# Patient Record
Sex: Male | Born: 1980
Health system: Southern US, Community
[De-identification: ages and names within clinical notes are randomized; demographics above are authoritative.]

## PROBLEM LIST (undated history)

## (undated) DIAGNOSIS — F250 Schizoaffective disorder, bipolar type: Secondary | ICD-10-CM

## (undated) DIAGNOSIS — F259 Schizoaffective disorder, unspecified: Secondary | ICD-10-CM

## (undated) HISTORY — PX: LUMBAR SPINE SURGERY: SHX701

---
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
## 2007-11-25 ENCOUNTER — Emergency Department (HOSPITAL_COMMUNITY): Admission: EM | Admit: 2007-11-25 | Discharge: 2007-11-25 | Payer: Self-pay | Admitting: Emergency Medicine

## 2008-03-16 ENCOUNTER — Emergency Department (HOSPITAL_COMMUNITY): Admission: EM | Admit: 2008-03-16 | Discharge: 2008-03-16 | Payer: Self-pay | Admitting: Emergency Medicine

## 2009-05-09 ENCOUNTER — Emergency Department (HOSPITAL_COMMUNITY): Admission: EM | Admit: 2009-05-09 | Discharge: 2009-05-09 | Payer: Self-pay | Admitting: Emergency Medicine

## 2011-08-09 ENCOUNTER — Emergency Department (HOSPITAL_COMMUNITY)
Admission: EM | Admit: 2011-08-09 | Discharge: 2011-08-10 | Disposition: A | Discharge status: Home / Self Care | Payer: Self-pay | Attending: Emergency Medicine | Admitting: Emergency Medicine

## 2011-08-09 ENCOUNTER — Inpatient Hospital Stay (HOSPITAL_COMMUNITY): Admission: AD | Admit: 2011-08-09 | Payer: PRIVATE HEALTH INSURANCE | Source: Ambulatory Visit | Admitting: Psychiatry

## 2011-08-09 ENCOUNTER — Encounter (HOSPITAL_COMMUNITY): Payer: Self-pay | Admitting: *Deleted

## 2011-08-09 DIAGNOSIS — R443 Hallucinations, unspecified: Secondary | ICD-10-CM | POA: Insufficient documentation

## 2011-08-09 DIAGNOSIS — F411 Generalized anxiety disorder: Secondary | ICD-10-CM | POA: Insufficient documentation

## 2011-08-09 DIAGNOSIS — F329 Major depressive disorder, single episode, unspecified: Secondary | ICD-10-CM

## 2011-08-09 DIAGNOSIS — F17200 Nicotine dependence, unspecified, uncomplicated: Secondary | ICD-10-CM | POA: Insufficient documentation

## 2011-08-09 DIAGNOSIS — F3289 Other specified depressive episodes: Secondary | ICD-10-CM | POA: Insufficient documentation

## 2011-08-09 LAB — RAPID URINE DRUG SCREEN, HOSP PERFORMED
Amphetamines: NOT DETECTED
Barbiturates: NOT DETECTED
Cocaine: NOT DETECTED
Tetrahydrocannabinol: POSITIVE — AB

## 2011-08-09 LAB — CBC
HCT: 43.4 % (ref 39.0–52.0)
Hemoglobin: 14.9 g/dL (ref 13.0–17.0)
MCH: 33.3 pg (ref 26.0–34.0)
MCHC: 34.3 g/dL (ref 30.0–36.0)
MCV: 96.9 fL (ref 78.0–100.0)
Platelets: 208 10*3/uL (ref 150–400)
RBC: 4.48 MIL/uL (ref 4.22–5.81)
RDW: 13.3 % (ref 11.5–15.5)
WBC: 8.7 10*3/uL (ref 4.0–10.5)

## 2011-08-09 LAB — DIFFERENTIAL
Basophils Absolute: 0 10*3/uL (ref 0.0–0.1)
Basophils Relative: 0 % (ref 0–1)
Eosinophils Absolute: 0.1 10*3/uL (ref 0.0–0.7)
Neutro Abs: 6 10*3/uL (ref 1.7–7.7)
Neutrophils Relative %: 69 % (ref 43–77)

## 2011-08-09 LAB — COMPREHENSIVE METABOLIC PANEL
AST: 28 U/L (ref 0–37)
Albumin: 4.2 g/dL (ref 3.5–5.2)
Alkaline Phosphatase: 81 U/L (ref 39–117)
Chloride: 101 mEq/L (ref 96–112)
Potassium: 4.3 mEq/L (ref 3.5–5.1)
Total Bilirubin: 0.7 mg/dL (ref 0.3–1.2)
Total Protein: 7.6 g/dL (ref 6.0–8.3)

## 2011-08-09 MED ORDER — LORAZEPAM 1 MG PO TABS
1.0000 mg | ORAL_TABLET | Freq: Three times a day (TID) | ORAL | Status: DC | PRN
  Administered 2011-08-09: 1 mg via ORAL
  Filled 2011-08-09: qty 1

## 2011-08-09 MED ORDER — IBUPROFEN 600 MG PO TABS
600.0000 mg | ORAL_TABLET | Freq: Three times a day (TID) | ORAL | Status: DC | PRN
  Administered 2011-08-09: 600 mg via ORAL
  Filled 2011-08-09: qty 1

## 2011-08-09 MED ORDER — ACETAMINOPHEN 325 MG PO TABS: 650.0000 mg | ORAL_TABLET | ORAL | Status: DC | PRN

## 2011-08-09 MED ORDER — ZOLPIDEM TARTRATE 5 MG PO TABS: 5.0000 mg | ORAL_TABLET | Freq: Every evening | ORAL | Status: DC | PRN

## 2011-08-09 NOTE — ED Notes (Signed)
Pt states "I just can't keep doing this, it's my depression, @ this moment I don't want to hurt myself, don't want to hurt others @ this moment, I'm institutionalized, I'm tired of being trapped, can't do nothing"  [employer] remains with pt

## 2011-08-09 NOTE — ED Notes (Signed)
Request for tele-psych faxed and called in

## 2011-08-09 NOTE — ED Notes (Signed)
Pt. Had two belonging bags.

## 2011-08-09 NOTE — ED Notes (Signed)
Correction to previous pt belonging bags. Two bags locked in locker #40.

## 2011-08-09 NOTE — ED Notes (Signed)
Patient is hearing voice in his head... States that he just stays in the house and is afraid to leave house.  Patient says he does not know how to adapt outside of institution. Says that voice is only happy when he is in institution but does not recognize voice.

## 2011-08-09 NOTE — BH Assessment (Signed)
Assessment Note   Tony Morris is a 31 y.o.African American male. Pt was brought under IVC to [facility] by [employer], Rande Brunt. Grayling Congress, PhD, [facility]. IVC states that pt presented to [facility] due to, "the devil inside me," and pt reports that the, "demon," makes fun of him, laughs at him and commands him to hurt others. IVC also states that pt, "reported thoughts of hurting himself and hurting others and fearing he might respond." Pt reports the demon auditory hallucinations have been around for the last 6-7 months and this is the first time he is seeking help; pt reports he stays inside the home to prevent himself from doing what the demon tells him to do, such as hurt others. Pt reports he is tired of the demon voice and wants it to stop laughing at him. Pt reports feeling like a trapped or caged animal because pt prevents himself from leaving the house due to fear of hurting someone.  During assessment, pt denies current SI, denies current and past self-harm. Pt denies previous inpatient hospitalization or outpatient tx . Pt reports he has previous assault charges due to the demon making him do it; pt reports he had jail time due to assault. Pt denied current pending charges or upcoming court dates. Pt made no eye contact throughout the assessment, appeared anxious, depressed and disheveled. Pt first stated he would agree to inpatient if that was recommended but minutes later asked if he could have outpatient because he is feeling anxious in the ED and feels like he will "snap" with all that is going on (other pts are yelling in ED). During triage pt reported 48 beers in the past week, cocaine and marijuana use; during assessment patient denied substance use, then reported he drank 2 beers 08/08/11, and denied any other substance use. Pt was positive for THC.   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 No pertinent past medical history. Axis IV: economic  problems, housing problems, occupational problems and other psychosocial or environmental problems Axis V: 15  Past Medical History: History reviewed. No pertinent past medical history.  Past Surgical History  Procedure Date  . Lumbar spine surgery     Family History: No family history on file.  Social History:  reports that he has been smoking.  He does not have any smokeless tobacco history on file. He reports that he drinks about 28.8 ounces of alcohol per week. He reports that he uses illicit drugs (Cocaine and Marijuana).  Additional Social History:  Alcohol / Drug Use History of alcohol / drug use?: Yes Allergies: No Known Allergies  Home Medications:  Medications Prior to Admission  Medication Dose Route Frequency Provider Last Rate Last Dose  . acetaminophen (TYLENOL) tablet 650 mg  650 mg Oral Q4H PRN Juliet Rude. Pickering, MD      . ibuprofen (ADVIL,MOTRIN) tablet 600 mg  600 mg Oral Q8H PRN Juliet Rude. Pickering, MD   600 mg at 08/09/11 1920  . LORazepam (ATIVAN) tablet 1 mg  1 mg Oral Q8H PRN Juliet Rude. Pickering, MD   1 mg at 08/09/11 1919  . zolpidem (AMBIEN) tablet 5 mg  5 mg Oral QHS PRN Juliet Rude. Rubin Payor, MD       No current outpatient prescriptions on file as of 08/09/2011.    OB/GYN Status:  No LMP for male patient.  General Assessment Data Location of Assessment: [facility] Living Arrangements: Relatives;Parent (Pt reports lives at different  pple's homes-mom, cousin) Can pt return to current living arrangement?: Yes Admission Status: Involuntary Is patient capable of signing voluntary admission?: No Transfer from: Other (Comment) (Pt assessed by Vesta Mixer, who signed IVC, trnsf [facility]) Referral Source: Other Museum/gallery curator)  Education Status Is patient currently in school?: No  Risk to self Suicidal Ideation: No-Not Currently/Within Last 6 Months (Pt reports SI comes and goes but denies current SI thoughts) Suicidal Intent: No-Not Currently/Within Last 6 Months Is  patient at risk for suicide?: No Suicidal Plan?: No Access to Means: No What has been your use of drugs/alcohol within the last 12 months?: Pt denies SA, reports two beers 08/08/11, denies other drug use Previous Attempts/Gestures: No How many times?: 0  Other Self Harm Risks: Denies Triggers for Past Attempts: None known Intentional Self Injurious Behavior: None Family Suicide History: Unknown (Pt reported he did not know) Recent stressful life event(s): Other (Comment) (Pt reports AH and moving a lot) Persecutory voices/beliefs?: No Depression: Yes Depression Symptoms: Insomnia;Tearfulness;Isolating;Guilt;Loss of interest in usual pleasures;Feeling worthless/self pity;Feeling angry/irritable;Fatigue Substance abuse history and/or treatment for substance abuse?: Yes (Pt denied at assessment; Pt reported substance use at triage) Suicide prevention information given to non-admitted patients: Not applicable  Risk to Others Homicidal Ideation: Yes-Currently Present Thoughts of Harm to Others: Yes-Currently Present Comment - Thoughts of Harm to Others: Pt reports demon inside of him tells him to do violent actions; denies specific plan currently; pt reported previous assault charges for which he went to jail Current Homicidal Intent: No-Not Currently/Within Last 6 Months (Pt denies current intent but reports he feels he can "snap" ) Current Homicidal Plan: No-Not Currently/Within Last 6 Months Access to Homicidal Means: No Identified Victim: Denies History of harm to others?: Yes Assessment of Violence: On admission Violent Behavior Description: Pt reports he has past assualt charges; denies current pending charges or court dates Does patient have access to weapons?: No Criminal Charges Pending?: No Does patient have a court date: No  Psychosis Hallucinations: Auditory (Pt reports a demon is laughing at him & commanding him) Delusions: None noted  Mental Status Report Appear/Hygiene:  Disheveled Eye Contact: Poor Motor Activity: Agitation;Restlessness Speech: Logical/coherent Level of Consciousness: Alert Mood: Depressed;Helpless;Sad Affect: Depressed;Anxious Anxiety Level: Moderate Thought Processes: Coherent;Relevant Judgement: Impaired Orientation: Person;Place;Time;Situation Obsessive Compulsive Thoughts/Behaviors: None  Cognitive Functioning Concentration: Decreased Memory: Remote Intact;Recent Intact IQ: Average Insight: Poor Impulse Control: Poor Appetite: Poor Weight Loss: 0  Weight Gain: 0  Sleep: Decreased Total Hours of Sleep: 4  Vegetative Symptoms: None (Pt not leaving home for fear of what voices tell him to do)  Prior Inpatient Therapy Prior Inpatient Therapy: No Prior Therapy Dates: N/A Prior Therapy Facilty/Provider(s): N/A Reason for Treatment: N/A  Prior Outpatient Therapy Prior Outpatient Therapy: No Prior Therapy Dates: N/A Prior Therapy Facilty/Provider(s): N/A Reason for Treatment: N/A  ADL Screening (condition at time of admission) Patient's cognitive ability adequate to safely complete daily activities?: Yes Patient able to express need for assistance with ADLs?: Yes Independently performs ADLs?: Yes Weakness of Legs: None Weakness of Arms/Hands: None  Home Assistive Devices/Equipment Home Assistive Devices/Equipment: None    Abuse/Neglect Assessment (Assessment to be complete while patient is alone) Physical Abuse: Denies Verbal Abuse: Denies Sexual Abuse: Denies Exploitation of patient/patient's resources: Denies Self-Neglect: Denies Values / Beliefs Cultural Requests During Hospitalization: None Spiritual Requests During Hospitalization: None     Nutrition Screen Diet: NPO Unintentional weight loss greater than 10lbs within the last month: No Dysphagia: No Patient appears severely malnourished: No Pregnant or  Lactating: No  Additional Information 1:1 In Past 12 Months?: No CIRT Risk: No Elopement  Risk: No Does patient have medical clearance?: Yes     Disposition: Pt will be referred to [facility] for placement. Disposition Disposition of Patient: Other dispositions (Will ask for [facility] placement)  On Site Evaluation by:   Reviewed with Physician:     Constance Haw, Irwin Brakeman 08/09/2011 8:04 PM

## 2011-08-09 NOTE — ED Notes (Signed)
Pt states he has been in and out of prison and feels like he "can't do it anymore".  Pt denies SI/HI but has been depressed and having intermittent audible hallucinations.

## 2011-08-09 NOTE — ED Notes (Signed)
Two patient belonging bags locked in locker #39 

## 2011-08-09 NOTE — ED Provider Notes (Signed)
History     CSN: [id number]  Arrival date & time 08/09/11  1505   First MD Initiated Contact with Patient 08/09/11 1617      Chief Complaint  Patient presents with  . Medical Clearance    (Consider location/radiation/quality/duration/timing/severity/associated sxs/prior treatment) The history is provided by the patient.   patient was brought in by [facility] after being involuntarily committed at [facility]. He states he is depressed. He states his hearing voices. He states he feels is too she lives from being in prison. He states is the devil on the inside and was telling him things. No fevers. No headache. He states he has a history depression but is not on any medications and is not supposed to be on medications. He states he's been treated for this before. No chest. He states he's cocaine once a month ago. He does drink alcohol. No other drugs.  History reviewed. No pertinent past medical history.  Past Surgical History  Procedure Date  . Lumbar spine surgery     No family history on file.  History  Substance Use Topics  . Smoking status: Current Everyday Smoker -- 1.0 packs/day  . Smokeless tobacco: Not on file  . Alcohol Use: 28.8 oz/week    48 Cans of beer per week      Review of Systems  Constitutional: Negative for activity change and appetite change.  HENT: Negative for neck stiffness.   Eyes: Negative for pain.  Respiratory: Negative for chest tightness and shortness of breath.   Cardiovascular: Negative for chest pain and leg swelling.  Gastrointestinal: Negative for nausea, vomiting, abdominal pain and diarrhea.  Genitourinary: Negative for flank pain.  Musculoskeletal: Negative for back pain.  Skin: Negative for rash.  Neurological: Negative for weakness, numbness and headaches.  Psychiatric/Behavioral: Positive for hallucinations. Negative for suicidal ideas, behavioral problems and confusion. The patient is nervous/anxious.     Allergies  Review of patient's  allergies indicates no known allergies.  Home Medications   Current Outpatient Rx  Name Route Sig Dispense Refill  . CITALOPRAM HYDROBROMIDE 10 MG PO TABS Oral Take 1 tablet (10 mg total) by mouth daily. 30 tablet 0    BP 108/72  Pulse 86  Temp(Src) 97.8 F (36.6 C) (Oral)  Resp 18  Wt 170 lb (77.111 kg)  SpO2 95%  Physical Exam  Nursing note and vitals reviewed. Constitutional: He is oriented to person, place, and time. He appears well-developed and well-nourished.  HENT:  Head: Normocephalic and atraumatic.  Eyes: EOM are normal. Pupils are equal, round, and reactive to light.  Neck: Normal range of motion. Neck supple.  Cardiovascular: Normal rate, regular rhythm and normal heart sounds.   No murmur heard. Pulmonary/Chest: Effort normal and breath sounds normal.  Abdominal: Soft. Bowel sounds are normal. He exhibits no distension and no mass. There is no tenderness. There is no rebound and no guarding.  Musculoskeletal: Normal range of motion. He exhibits no edema.  Neurological: He is alert and oriented to person, place, and time. No cranial nerve deficit.  Skin: Skin is warm and dry.  Psychiatric:       Patient appears anxious and pressured.    ED Course  Procedures (including critical care time)  Labs Reviewed  COMPREHENSIVE METABOLIC PANEL - Abnormal; Notable for the following:    Glucose, Bld 101 (*)    All other components within normal limits  URINE RAPID DRUG SCREEN (HOSP PERFORMED) - Abnormal; Notable for the following:    Tetrahydrocannabinol POSITIVE (*)  All other components within normal limits  CBC  DIFFERENTIAL  ETHANOL   No results found.   1. Depression       MDM  Patient came in IVC from [place]. He stated he was depressed and having hallucinations. Lab work is reassuring. He was seen by Telepsych and the IVC was rescinded. He was started on Celexa. He'll followup after discussing with ACT and possibly with Janeece Agee  R. Rubin Payor, MD 08/10/11 1610

## 2011-08-10 MED ORDER — CITALOPRAM HYDROBROMIDE 10 MG PO TABS: 10.0000 mg | ORAL_TABLET | Freq: Every day | ORAL | Status: DC

## 2011-08-10 NOTE — ED Notes (Signed)
Discharge instructions reviewed and pt verbalizes understanding. Pt able to contract for safety and states he does not want to hurt himself. Departs in safe and stable condition.

## 2013-05-04 ENCOUNTER — Emergency Department (HOSPITAL_BASED_OUTPATIENT_CLINIC_OR_DEPARTMENT_OTHER): Payer: No Typology Code available for payment source

## 2013-05-04 ENCOUNTER — Encounter (HOSPITAL_BASED_OUTPATIENT_CLINIC_OR_DEPARTMENT_OTHER): Payer: Self-pay | Admitting: Emergency Medicine

## 2013-05-04 ENCOUNTER — Emergency Department (HOSPITAL_BASED_OUTPATIENT_CLINIC_OR_DEPARTMENT_OTHER)
Admission: EM | Admit: 2013-05-04 | Discharge: 2013-05-04 | Disposition: A | Discharge status: Home / Self Care | Payer: No Typology Code available for payment source | Attending: Emergency Medicine | Admitting: Emergency Medicine

## 2013-05-04 DIAGNOSIS — S29019A Strain of muscle and tendon of unspecified wall of thorax, initial encounter: Secondary | ICD-10-CM

## 2013-05-04 DIAGNOSIS — F17200 Nicotine dependence, unspecified, uncomplicated: Secondary | ICD-10-CM | POA: Insufficient documentation

## 2013-05-04 DIAGNOSIS — F259 Schizoaffective disorder, unspecified: Secondary | ICD-10-CM | POA: Insufficient documentation

## 2013-05-04 DIAGNOSIS — Z79899 Other long term (current) drug therapy: Secondary | ICD-10-CM | POA: Insufficient documentation

## 2013-05-04 DIAGNOSIS — Y92410 Unspecified street and highway as the place of occurrence of the external cause: Secondary | ICD-10-CM | POA: Insufficient documentation

## 2013-05-04 DIAGNOSIS — S161XXA Strain of muscle, fascia and tendon at neck level, initial encounter: Secondary | ICD-10-CM

## 2013-05-04 DIAGNOSIS — Y939 Activity, unspecified: Secondary | ICD-10-CM | POA: Insufficient documentation

## 2013-05-04 DIAGNOSIS — S239XXA Sprain of unspecified parts of thorax, initial encounter: Secondary | ICD-10-CM | POA: Insufficient documentation

## 2013-05-04 DIAGNOSIS — S335XXA Sprain of ligaments of lumbar spine, initial encounter: Secondary | ICD-10-CM | POA: Insufficient documentation

## 2013-05-04 HISTORY — DX: Schizoaffective disorder, bipolar type: F25.0

## 2013-05-04 HISTORY — DX: Schizoaffective disorder, unspecified: F25.9

## 2013-05-04 MED ORDER — CYCLOBENZAPRINE HCL 10 MG PO TABS: 10.0000 mg | ORAL_TABLET | Freq: Two times a day (BID) | ORAL | Status: DC | PRN

## 2013-05-04 MED ORDER — OXYCODONE-ACETAMINOPHEN 5-325 MG PO TABS: 1.0000 | ORAL_TABLET | Freq: Four times a day (QID) | ORAL | Status: DC | PRN

## 2013-05-04 MED ORDER — OXYCODONE-ACETAMINOPHEN 5-325 MG PO TABS
1.0000 | ORAL_TABLET | Freq: Once | ORAL | Status: AC
  Administered 2013-05-04: 1 via ORAL
  Filled 2013-05-04: qty 1
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
## 2013-05-05 ENCOUNTER — Encounter (HOSPITAL_BASED_OUTPATIENT_CLINIC_OR_DEPARTMENT_OTHER): Payer: Self-pay | Admitting: Emergency Medicine

## 2013-05-05 ENCOUNTER — Emergency Department (HOSPITAL_BASED_OUTPATIENT_CLINIC_OR_DEPARTMENT_OTHER): Payer: No Typology Code available for payment source

## 2013-05-05 ENCOUNTER — Emergency Department (HOSPITAL_BASED_OUTPATIENT_CLINIC_OR_DEPARTMENT_OTHER)
Admission: EM | Admit: 2013-05-05 | Discharge: 2013-05-05 | Disposition: A | Discharge status: Home / Self Care | Payer: No Typology Code available for payment source | Attending: Emergency Medicine | Admitting: Emergency Medicine

## 2013-05-05 DIAGNOSIS — Z79899 Other long term (current) drug therapy: Secondary | ICD-10-CM | POA: Insufficient documentation

## 2013-05-05 DIAGNOSIS — F17200 Nicotine dependence, unspecified, uncomplicated: Secondary | ICD-10-CM | POA: Insufficient documentation

## 2013-05-05 DIAGNOSIS — M5430 Sciatica, unspecified side: Secondary | ICD-10-CM | POA: Insufficient documentation

## 2013-05-05 DIAGNOSIS — M25559 Pain in unspecified hip: Secondary | ICD-10-CM | POA: Insufficient documentation

## 2013-05-05 DIAGNOSIS — G8911 Acute pain due to trauma: Secondary | ICD-10-CM | POA: Insufficient documentation

## 2013-05-05 DIAGNOSIS — Z8659 Personal history of other mental and behavioral disorders: Secondary | ICD-10-CM | POA: Insufficient documentation

## 2013-05-05 DIAGNOSIS — M5431 Sciatica, right side: Secondary | ICD-10-CM

## 2013-05-05 MED ORDER — MELOXICAM 7.5 MG PO TABS: 7.5000 mg | ORAL_TABLET | Freq: Every day | ORAL | Status: DC

## 2013-05-05 MED ORDER — IBUPROFEN 800 MG PO TABS
800.0000 mg | ORAL_TABLET | Freq: Once | ORAL | Status: AC
  Administered 2013-05-05: 800 mg via ORAL
  Filled 2013-05-05: qty 1

## 2013-05-05 NOTE — ED Notes (Signed)
MD at bedside. 
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
## 2013-05-05 NOTE — ED Notes (Signed)
Patient transported to X-ray via wheelchair per tech. 
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
## 2013-05-07 ENCOUNTER — Encounter (HOSPITAL_BASED_OUTPATIENT_CLINIC_OR_DEPARTMENT_OTHER): Payer: Self-pay | Admitting: Emergency Medicine

## 2014-12-09 ENCOUNTER — Encounter (HOSPITAL_BASED_OUTPATIENT_CLINIC_OR_DEPARTMENT_OTHER): Payer: Self-pay | Admitting: *Deleted

## 2014-12-09 ENCOUNTER — Emergency Department (HOSPITAL_BASED_OUTPATIENT_CLINIC_OR_DEPARTMENT_OTHER)
Admission: EM | Admit: 2014-12-09 | Discharge: 2014-12-09 | Disposition: A | Discharge status: Home / Self Care | Payer: Medicaid Other | Attending: Emergency Medicine | Admitting: Emergency Medicine

## 2014-12-09 DIAGNOSIS — Z720 Tobacco use: Secondary | ICD-10-CM | POA: Insufficient documentation

## 2014-12-09 DIAGNOSIS — Z202 Contact with and (suspected) exposure to infections with a predominantly sexual mode of transmission: Secondary | ICD-10-CM | POA: Diagnosis present

## 2014-12-09 DIAGNOSIS — Z79899 Other long term (current) drug therapy: Secondary | ICD-10-CM | POA: Insufficient documentation

## 2014-12-09 DIAGNOSIS — F259 Schizoaffective disorder, unspecified: Secondary | ICD-10-CM | POA: Diagnosis not present

## 2014-12-09 MED ORDER — CEFTRIAXONE SODIUM 250 MG IJ SOLR
250.0000 mg | Freq: Once | INTRAMUSCULAR | Status: AC
  Administered 2014-12-09: 250 mg via INTRAMUSCULAR
  Filled 2014-12-09: qty 250

## 2014-12-09 MED ORDER — AZITHROMYCIN 250 MG PO TABS
1000.0000 mg | ORAL_TABLET | Freq: Once | ORAL | Status: AC
  Administered 2014-12-09: 1000 mg via ORAL
  Filled 2014-12-09: qty 4

## 2014-12-09 NOTE — ED Notes (Signed)
Pt reports girlfriend has an STD. Pt denies symptoms.

## 2014-12-09 NOTE — ED Provider Notes (Signed)
CSN: [id number]     Arrival date & time 12/09/14  1102 History   First MD Initiated Contact with Patient 12/09/14 1115     Chief Complaint  Patient presents with  . Exposure to STD     (Consider location/radiation/quality/duration/timing/severity/associated sxs/prior Treatment) HPI Tony Morris is a 34 y.o. male Who presents to emergency department with a concern of possible exposure to STD. Patient states his girlfriend was just diagnosed with chlamydia. He denies any symptoms. No penile discharge, no dysuria, no pain with intercourse. Denies fever or chills. No history of STD in the past. Patient requests treatment.  Past Medical History  Diagnosis Date  . Schizo affective schizophrenia    Past Surgical History  Procedure Laterality Date  . Lumbar spine surgery     History reviewed. No pertinent family history. History  Substance Use Topics  . Smoking status: Current Every Day Smoker -- 0.50 packs/day    Types: Cigarettes  . Smokeless tobacco: Not on file  . Alcohol Use: No    Review of Systems  Constitutional: Negative for fever and chills.  Genitourinary: Negative for dysuria, penile swelling, scrotal swelling and penile pain.  Skin: Positive for rash.      Allergies  Review of patient's allergies indicates no known allergies.  Home Medications   Prior to Admission medications   Medication Sig Start Date End Date Taking? Authorizing Provider  citalopram (CELEXA) 10 MG tablet Take 1 tablet (10 mg total) by mouth daily. 08/10/11 08/09/12  Benjiman CoreNathan Pickering, MD  escitalopram (LEXAPRO) 10 MG tablet Take by mouth daily.    Historical Provider, MD  QUEtiapine (SEROQUEL) 100 MG tablet Take by mouth at bedtime.    Historical Provider, MD   BP 116/67 mmHg  Pulse 69  Temp(Src) 98.4 F (36.9 C) (Oral)  Resp 18  Ht 6\' 2"  (1.88 m)  Wt 160 lb (72.576 kg)  BMI 20.53 kg/m2  SpO2 98% Physical Exam  Constitutional: He appears well-developed and well-nourished. No  distress.  Eyes: Conjunctivae are normal.  Neck: Neck supple.  Genitourinary: Penis normal.  Skin: Skin is warm and dry.  Nursing note and vitals reviewed.   ED Course  Procedures (including critical care time) Labs Review Labs Reviewed - No data to display  Imaging Review No results found.   EKG Interpretation None      MDM   Final diagnoses:  Exposure to STD   Patient with no complaints, exposure to STD, girlfriend tested positive for chlamydia. Will treat with Rocephin 250 mg IM, Zithromax 1 g by mouth. Follow up with Health department.  Filed Vitals:   12/09/14 1112  BP: 116/67  Pulse: 69  Temp: 98.4 F (36.9 C)  TempSrc: Oral  Resp: 18  Height: 6\' 2"  (1.88 m)  Weight: 160 lb (72.576 kg)  SpO2: 98%       Jaynie Crumbleatyana Denis Koppel, PA-C 12/09/14 1702  Arby BarretteMarcy Pfeiffer, MD 12/09/14 1715

## 2014-12-09 NOTE — Discharge Instructions (Signed)
Follow up with health dept as needed. No intercourse for 1 week.    Sexually Transmitted Disease A sexually transmitted disease (STD) is a disease or infection that may be passed (transmitted) from person to person, usually during sexual activity. This may happen by way of saliva, semen, blood, vaginal mucus, or urine. Common STDs include:   Gonorrhea.   Chlamydia.   Syphilis.   HIV and AIDS.   Genital herpes.   Hepatitis B and C.   Trichomonas.   Human papillomavirus (HPV).   Pubic lice.   Scabies.  Mites.  Bacterial vaginosis. WHAT ARE CAUSES OF STDs? An STD may be caused by bacteria, a virus, or parasites. STDs are often transmitted during sexual activity if one person is infected. However, they may also be transmitted through nonsexual means. STDs may be transmitted after:   Sexual intercourse with an infected person.   Sharing sex toys with an infected person.   Sharing needles with an infected person or using unclean piercing or tattoo needles.  Having intimate contact with the genitals, mouth, or rectal areas of an infected person.   Exposure to infected fluids during birth. WHAT ARE THE SIGNS AND SYMPTOMS OF STDs? Different STDs have different symptoms. Some people may not have any symptoms. If symptoms are present, they may include:   Painful or bloody urination.   Pain in the pelvis, abdomen, vagina, anus, throat, or eyes.   A skin rash, itching, or irritation.  Growths, ulcerations, blisters, or sores in the genital and anal areas.  Abnormal vaginal discharge with or without bad odor.   Penile discharge in men.   Fever.   Pain or bleeding during sexual intercourse.   Swollen glands in the groin area.   Yellow skin and eyes (jaundice). This is seen with hepatitis.   Swollen testicles.  Infertility.  Sores and blisters in the mouth. HOW ARE STDs DIAGNOSED? To make a diagnosis, your health care provider may:   Take a  medical history.   Perform a physical exam.   Take a sample of any discharge to examine.  Swab the throat, cervix, opening to the penis, rectum, or vagina for testing.  Test a sample of your first morning urine.   Perform blood tests.   Perform a Pap test, if this applies.   Perform a colposcopy.   Perform a laparoscopy.  HOW ARE STDs TREATED? Treatment depends on the STD. Some STDs may be treated but not cured.   Chlamydia, gonorrhea, trichomonas, and syphilis can be cured with antibiotic medicine.   Genital herpes, hepatitis, and HIV can be treated, but not cured, with prescribed medicines. The medicines lessen symptoms.   Genital warts from HPV can be treated with medicine or by freezing, burning (electrocautery), or surgery. Warts may come back.   HPV cannot be cured with medicine or surgery. However, abnormal areas may be removed from the cervix, vagina, or vulva.   If your diagnosis is confirmed, your recent sexual partners need treatment. This is true even if they are symptom-free or have a negative culture or evaluation. They should not have sex until their health care providers say it is okay. HOW CAN I REDUCE MY RISK OF GETTING AN STD? Take these steps to reduce your risk of getting an STD:  Use latex condoms, dental dams, and water-soluble lubricants during sexual activity. Do not use petroleum jelly or oils.  Avoid having multiple sex partners.  Do not have sex with someone who has other sex partners.  Do not have sex with anyone you do not know or who is at high risk for an STD.  Avoid risky sex practices that can break your skin.  Do not have sex if you have open sores on your mouth or skin.  Avoid drinking too much alcohol or taking illegal drugs. Alcohol and drugs can affect your judgment and put you in a vulnerable position.  Avoid engaging in oral and anal sex acts.  Get vaccinated for HPV and hepatitis. If you have not received these  vaccines in the past, talk to your health care provider about whether one or both might be right for you.   If you are at risk of being infected with HIV, it is recommended that you take a prescription medicine daily to prevent HIV infection. This is called pre-exposure prophylaxis (PrEP). You are considered at risk if:  You are a man who has sex with other men (MSM).  You are a heterosexual man or woman and are sexually active with more than one partner.  You take drugs by injection.  You are sexually active with a partner who has HIV.  Talk with your health care provider about whether you are at high risk of being infected with HIV. If you choose to begin PrEP, you should first be tested for HIV. You should then be tested every 3 months for as long as you are taking PrEP.  WHAT SHOULD I DO IF I THINK I HAVE AN STD?  See your health care provider.   Tell your sexual partner(s). They should be tested and treated for any STDs.  Do not have sex until your health care provider says it is okay. WHEN SHOULD I GET IMMEDIATE MEDICAL CARE? Contact your health care provider right away if:   You have severe abdominal pain.  You are a man and notice swelling or pain in your testicles.  You are a woman and notice swelling or pain in your vagina. Document Released: 09/18/2002 Document Revised: 07/03/2013 Document Reviewed: 01/16/2013 [facility] Patient Information 2015 [place], [place]. This information is not intended to replace advice given to you by your health care provider. Make sure you discuss any questions you have with your health care provider.

## 2014-12-17 ENCOUNTER — Emergency Department (HOSPITAL_COMMUNITY)
Admission: EM | Admit: 2014-12-17 | Discharge: 2014-12-17 | Disposition: A | Discharge status: Home / Self Care | Payer: Self-pay

## 2014-12-17 ENCOUNTER — Emergency Department (HOSPITAL_COMMUNITY)
Admission: EM | Admit: 2014-12-17 | Discharge: 2014-12-17 | Disposition: A | Discharge status: Home / Self Care | Payer: Medicaid Other | Attending: Emergency Medicine | Admitting: Emergency Medicine

## 2014-12-17 ENCOUNTER — Encounter (HOSPITAL_COMMUNITY): Payer: Self-pay | Admitting: Physical Medicine and Rehabilitation

## 2014-12-17 DIAGNOSIS — Z720 Tobacco use: Secondary | ICD-10-CM | POA: Diagnosis not present

## 2014-12-17 DIAGNOSIS — Z202 Contact with and (suspected) exposure to infections with a predominantly sexual mode of transmission: Secondary | ICD-10-CM | POA: Diagnosis not present

## 2014-12-17 DIAGNOSIS — F259 Schizoaffective disorder, unspecified: Secondary | ICD-10-CM | POA: Diagnosis not present

## 2014-12-17 LAB — URINALYSIS, ROUTINE W REFLEX MICROSCOPIC
BILIRUBIN URINE: NEGATIVE
Glucose, UA: NEGATIVE mg/dL
HGB URINE DIPSTICK: NEGATIVE
KETONES UR: NEGATIVE mg/dL
Leukocytes, UA: NEGATIVE
Nitrite: NEGATIVE
PH: 6.5 (ref 5.0–8.0)
PROTEIN: NEGATIVE mg/dL
Specific Gravity, Urine: 1.01 (ref 1.005–1.030)
Urobilinogen, UA: 0.2 mg/dL (ref 0.0–1.0)

## 2014-12-17 MED ORDER — AZITHROMYCIN 250 MG PO TABS
1000.0000 mg | ORAL_TABLET | Freq: Once | ORAL | Status: AC
  Administered 2014-12-17: 1000 mg via ORAL
  Filled 2014-12-17: qty 4

## 2014-12-17 NOTE — ED Notes (Signed)
David, NP at the bedside.  

## 2014-12-17 NOTE — ED Provider Notes (Signed)
CSN: [id number]     Arrival date & time 12/17/14  1653 History  This chart was scribed for non-physician practitioner, Felicie Mornavid Kirstan Fentress, NP working with Derwood KaplanAnkit Nanavati, MD by Gwenyth Oberatherine Macek, ED scribe. This patient was seen in room TR10C/TR10C and the patient's care was started at 5:35 PM   Chief Complaint  Patient presents with  . Exposure to STD   The history is provided by the patient. No language interpreter was used.   HPI Comments: Tony Morris is a 34 y.o. male who presents to the Emergency Department complaining of possible exposure to chlamydia. Pt reports that his "lady friend" tested positive for chlamydia during a recent pap smear. He endorses unprotected intercourse and states that she is his only partner. She does not have any symptoms currently. Pt denies penile pain, penile discharge, dysuria, testicular pain, fever and chills as associated symptoms.   Past Medical History  Diagnosis Date  . Schizo affective schizophrenia    Past Surgical History  Procedure Laterality Date  . Lumbar spine surgery     History reviewed. No pertinent family history. History  Substance Use Topics  . Smoking status: Current Every Day Smoker -- 0.50 packs/day    Types: Cigarettes  . Smokeless tobacco: Not on file  . Alcohol Use: No    Review of Systems  Constitutional: Negative for fever and chills.  Genitourinary: Negative for dysuria, discharge, penile pain and testicular pain.  All other systems reviewed and are negative.     Allergies  Review of patient's allergies indicates no known allergies.  Home Medications   Prior to Admission medications   Medication Sig Start Date End Date Taking? Authorizing Provider  citalopram (CELEXA) 10 MG tablet Take 1 tablet (10 mg total) by mouth daily. 08/10/11 08/09/12  Benjiman CoreNathan Pickering, MD  escitalopram (LEXAPRO) 10 MG tablet Take by mouth daily.    Historical Provider, MD  QUEtiapine (SEROQUEL) 100 MG tablet Take by mouth at bedtime.     Historical Provider, MD   BP 111/65 mmHg  Pulse 72  Temp(Src) 98 F (36.7 C) (Oral)  Resp 16  Ht 6\' 2"  (1.88 m)  Wt 162 lb (73.483 kg)  BMI 20.79 kg/m2  SpO2 98% Physical Exam  Constitutional: He appears well-developed and well-nourished. No distress.  HENT:  Head: Normocephalic and atraumatic.  Eyes: Conjunctivae and EOM are normal.  Neck: Neck supple. No tracheal deviation present.  Cardiovascular: Normal rate, regular rhythm and normal heart sounds.   Pulmonary/Chest: Effort normal and breath sounds normal. No respiratory distress.  Skin: Skin is warm and dry.  Psychiatric: He has a normal mood and affect. His behavior is normal.  Nursing note and vitals reviewed.   ED Course  Procedures   DIAGNOSTIC STUDIES: Oxygen Saturation is 98% on RA, normal by my interpretation.    COORDINATION OF CARE: 5:37 PM Discussed treatment plan with pt which includes Azithromycin. Pt agreed to plan.   Labs Review Labs Reviewed  URINALYSIS, ROUTINE W REFLEX MICROSCOPIC (NOT AT [facility])    Imaging Review No results found.   EKG Interpretation None      MDM   Final diagnoses:  None    Exposure to chlamydia. Azithromycin 1 gram in ED.  I personally performed the services described in this documentation, which was scribed in my presence. The recorded information has been reviewed and is accurate.    Felicie Mornavid Pratyush Ammon, NP 12/18/14 0025  Derwood KaplanAnkit Nanavati, MD 12/19/14 [id number]

## 2014-12-17 NOTE — ED Notes (Signed)
Pt presents to department for evaluation of possible STD. Reports girlfriend tested positive for chlamydia. Pt requesting to be tested. Denies symptoms at the time.

## 2014-12-17 NOTE — Discharge Instructions (Signed)
Chlamydia Chlamydia is an infection. It is spread through sexual contact. Chlamydia can be in different areas of the body. These areas include the urethra, throat, or rectum. It is important to treat chlamydia as soon as possible. It can damage other organs.  CAUSES  Chlamydia is caused by bacteria. It is a sexually transmitted disease. This means that it is passed from an infected partner during intimate contact. This contact could be with the genitals, mouth, or rectal area.  SIGNS AND SYMPTOMS  There may not be any symptoms. This is often the case early in the infection. If there are symptoms, they are usually mild and may only be noticeable in the morning. Symptoms you may notice include:   Burning with urination.  Pain or swelling in the testicles.  Watery mucus-like discharge from the penis.  Long-standing (chronic) pelvic pain after frequent infections.  Pain, swelling, or itching around the anus.  A sore throat.  Itching, burning, or redness in the eyes, or discharge from the eyes. .  TREATMENT  Chlamydia is treated with antibiotic medicines.  HOME CARE INSTRUCTIONS   Take medicines only as directed by your health care provider.   Rest.   Inform any sexual partners about your infection. Even if they are symptom free or have a negative culture or evaluation, they should be treated for the condition.   Do not have sex (intercourse) until treatment is completed and your health care provider says it is okay.   Keep all follow-up visits as directed by your health care provider.   Not all test results are available during your visit. If your test results are not back during the visit, make an appointment with your health care provider to find out the results. Do not assume everything is normal if you have not heard from your health care provider or the medical facility. It is your responsibility to get your test results. SEEK MEDICAL CARE IF:  You develop new joint  pain.  You have a fever. SEEK IMMEDIATE MEDICAL CARE IF:   Your pain increases.   You have abnormal discharge.   You have pain during intercourse. MAKE SURE YOU:   Understand these instructions.  Will watch your condition.  Will get help right away if you are not doing well or get worse. Document Released: 06/28/2005 Document Revised: 11/12/2013 Document Reviewed: 01/04/2013 [facility] Patient Information 2015 [place], [place]. This information is not intended to replace advice given to you by your health care provider. Make sure you discuss any questions you have with your health care provider.

## 2015-02-23 ENCOUNTER — Emergency Department (HOSPITAL_BASED_OUTPATIENT_CLINIC_OR_DEPARTMENT_OTHER)
Admission: EM | Admit: 2015-02-23 | Discharge: 2015-02-23 | Disposition: A | Discharge status: Home / Self Care | Payer: Medicaid Other | Attending: Emergency Medicine | Admitting: Emergency Medicine

## 2015-02-23 ENCOUNTER — Encounter (HOSPITAL_BASED_OUTPATIENT_CLINIC_OR_DEPARTMENT_OTHER): Payer: Self-pay | Admitting: Emergency Medicine

## 2015-02-23 DIAGNOSIS — Z79899 Other long term (current) drug therapy: Secondary | ICD-10-CM | POA: Diagnosis not present

## 2015-02-23 DIAGNOSIS — M62838 Other muscle spasm: Secondary | ICD-10-CM | POA: Insufficient documentation

## 2015-02-23 DIAGNOSIS — L259 Unspecified contact dermatitis, unspecified cause: Secondary | ICD-10-CM | POA: Diagnosis not present

## 2015-02-23 DIAGNOSIS — Z8659 Personal history of other mental and behavioral disorders: Secondary | ICD-10-CM | POA: Insufficient documentation

## 2015-02-23 DIAGNOSIS — M25511 Pain in right shoulder: Secondary | ICD-10-CM | POA: Insufficient documentation

## 2015-02-23 DIAGNOSIS — Z720 Tobacco use: Secondary | ICD-10-CM | POA: Insufficient documentation

## 2015-02-23 DIAGNOSIS — R21 Rash and other nonspecific skin eruption: Secondary | ICD-10-CM | POA: Diagnosis present

## 2015-02-23 MED ORDER — IBUPROFEN 800 MG PO TABS: 800.0000 mg | ORAL_TABLET | Freq: Three times a day (TID) | ORAL | Status: DC

## 2015-02-23 MED ORDER — TRIAMCINOLONE ACETONIDE 0.1 % EX CREA: 1.0000 "application " | TOPICAL_CREAM | Freq: Two times a day (BID) | CUTANEOUS | Status: DC

## 2015-02-23 NOTE — ED Notes (Addendum)
Patient has 2 larger areas of redness and rash to his back. Paient also reports that he has a lump under his left axilla region and swelling to his neck

## 2015-02-23 NOTE — ED Notes (Signed)
Denies any injury or straining to shoulders

## 2015-02-23 NOTE — Discharge Instructions (Signed)
Apply triamcinolone cream twice daily to the affected area. Use ibuprofen as directed as needed for shoulder pain.  Contact Dermatitis Contact dermatitis is a reaction to certain substances that touch the skin. Contact dermatitis can be either irritant contact dermatitis or allergic contact dermatitis. Irritant contact dermatitis does not require previous exposure to the substance for a reaction to occur.Allergic contact dermatitis only occurs if you have been exposed to the substance before. Upon a repeat exposure, your body reacts to the substance.  CAUSES  Many substances can cause contact dermatitis. Irritant dermatitis is most commonly caused by repeated exposure to mildly irritating substances, such as:  Makeup.  Soaps.  Detergents.  Bleaches.  Acids.  Metal salts, such as nickel. Allergic contact dermatitis is most commonly caused by exposure to:  Poisonous plants.  Chemicals (deodorants, shampoos).  Jewelry.  Latex.  Neomycin in triple antibiotic cream.  Preservatives in products, including clothing. SYMPTOMS  The area of skin that is exposed may develop:  Dryness or flaking.  Redness.  Cracks.  Itching.  Pain or a burning sensation.  Blisters. With allergic contact dermatitis, there may also be swelling in areas such as the eyelids, mouth, or genitals.  DIAGNOSIS  Your caregiver can usually tell what the problem is by doing a physical exam. In cases where the cause is uncertain and an allergic contact dermatitis is suspected, a patch skin test may be performed to help determine the cause of your dermatitis. TREATMENT Treatment includes protecting the skin from further contact with the irritating substance by avoiding that substance if possible. Barrier creams, powders, and gloves may be helpful. Your caregiver may also recommend:  Steroid creams or ointments applied 2 times daily. For best results, soak the rash area in cool water for 20 minutes. Then  apply the medicine. Cover the area with a plastic wrap. You can store the steroid cream in the refrigerator for a "chilly" effect on your rash. That may decrease itching. Oral steroid medicines may be needed in more severe cases.  Antibiotics or antibacterial ointments if a skin infection is present.  Antihistamine lotion or an antihistamine taken by mouth to ease itching.  Lubricants to keep moisture in your skin.  Burow's solution to reduce redness and soreness or to dry a weeping rash. Mix one packet or tablet of solution in 2 cups cool water. Dip a clean washcloth in the mixture, wring it out a bit, and put it on the affected area. Leave the cloth in place for 30 minutes. Do this as often as possible throughout the day.  Taking several cornstarch or baking soda baths daily if the area is too large to cover with a washcloth. Harsh chemicals, such as alkalis or acids, can cause skin damage that is like a burn. You should flush your skin for 15 to 20 minutes with cold water after such an exposure. You should also seek immediate medical care after exposure. Bandages (dressings), antibiotics, and pain medicine may be needed for severely irritated skin.  HOME CARE INSTRUCTIONS  Avoid the substance that caused your reaction.  Keep the area of skin that is affected away from hot water, soap, sunlight, chemicals, acidic substances, or anything else that would irritate your skin.  Do not scratch the rash. Scratching may cause the rash to become infected.  You may take cool baths to help stop the itching.  Only take over-the-counter or prescription medicines as directed by your caregiver.  See your caregiver for follow-up care as directed to make  sure your skin is healing properly. SEEK MEDICAL CARE IF:   Your condition is not better after 3 days of treatment.  You seem to be getting worse.  You see signs of infection such as swelling, tenderness, redness, soreness, or warmth in the affected  area.  You have any problems related to your medicines. Document Released: 06/25/2000 Document Revised: 09/20/2011 Document Reviewed: 12/01/2010 [facility] Patient Information 2015 [place], [place]. This information is not intended to replace advice given to you by your health care provider. Make sure you discuss any questions you have with your health care provider.  Spasticity Spasticity is a condition in which certain muscles contract continuously. This causes stiffness or tightness of the muscles. It may interfere with movement, speech, and manner of walking. CAUSES  This condition is usually caused by damage to the portion of the brain or spinal cord that controls voluntary movement. It may occur in association with:  Spinal cord injury.  Multiple sclerosis.  Cerebral palsy.  Brain damage due to lack of oxygen.  Brain trauma.  Severe head injury.  Metabolic diseases such as:  Adrenoleukodystrophy.  ALS Truddie [place] Gehrig's disease).  Phenylketonuria. SYMPTOMS   Increased muscle tone (hypertonicity).  A series of rapid muscle contractions (clonus).  Exaggerated deep tendon reflexes.  Muscle spasms.  Involuntary crossing of the legs (scissoring).  Fixed joints. The degree of spasticity varies. It ranges from mild muscle stiffness to severe, painful, and uncontrollable muscle spasms. It can interfere with rehabilitation in patients with certain disorders. It often interferes with daily activities. TREATMENT  Treatment may include:  Medications.  Physical therapy regimens. They may include muscle stretching and range of motion exercises. These help prevent shrinkage or shortening of muscles. They also help reduce the severity of symptoms.  Surgery. This may be recommended for tendon release or to sever the nerve-muscle pathway. PROGNOSIS  The outcome for those with spasticity depends on:  Severity of the spasticity.  Associated disorder(s). Document Released:  06/18/2002 Document Revised: 09/20/2011 Document Reviewed: 09/11/2013 [facility] Patient Information 2015 [place], [place]. This information is not intended to replace advice given to you by your health care provider. Make sure you discuss any questions you have with your health care provider.

## 2015-02-23 NOTE — ED Notes (Signed)
Presents with red areas x 2 on back, pt states noticed areas yesterday, has itching at sites, areas are both red in color and slightly raised.

## 2015-02-23 NOTE — ED Provider Notes (Signed)
CSN: [id number]     Arrival date & time 02/23/15  1543 History   First MD Initiated Contact with Patient 02/23/15 1551     Chief Complaint  Patient presents with  . Rash     (Consider location/radiation/quality/duration/timing/severity/associated sxs/prior Treatment) HPI Comments: 34 year old male presenting with a rash on his back that he noticed yesterday. The area is itchy. Reports being on the woods recently, sitting down on the ground and near bushes. No aggravating or alleviating factors. No new soaps, detergents, lotions or medications. Denies fevers. No difficulty breathing or swallowing. Also complaining of right shoulder pain beginning this morning and believes he slept funny. Pain worse when he looks towards the left. No injury or trauma. Pain worse when he moves his shoulder. No numbness or tingling.  Patient is a 34 y.o. male presenting with rash. The history is provided by the patient.  Rash   Past Medical History  Diagnosis Date  . Schizo affective schizophrenia    Past Surgical History  Procedure Laterality Date  . Lumbar spine surgery     History reviewed. No pertinent family history. Social History  Substance Use Topics  . Smoking status: Current Every Day Smoker -- 0.50 packs/day    Types: Cigarettes  . Smokeless tobacco: None  . Alcohol Use: No    Review of Systems  Musculoskeletal:       + R shoulder pain.  Skin: Positive for rash.  All other systems reviewed and are negative.     Allergies  Review of patient's allergies indicates no known allergies.  Home Medications   Prior to Admission medications   Medication Sig Start Date End Date Taking? Authorizing Provider  citalopram (CELEXA) 10 MG tablet Take 1 tablet (10 mg total) by mouth daily. 08/10/11 08/09/12  Benjiman Core, MD  escitalopram (LEXAPRO) 10 MG tablet Take by mouth daily.    Historical Provider, MD  ibuprofen (ADVIL,MOTRIN) 800 MG tablet Take 1 tablet (800 mg total) by mouth 3  (three) times daily. 02/23/15   Addisynn Vassell M Lavern Crimi, PA-C  QUEtiapine (SEROQUEL) 100 MG tablet Take by mouth at bedtime.    Historical Provider, MD  triamcinolone cream (KENALOG) 0.1 % Apply 1 application topically 2 (two) times daily. 02/23/15   Dartanion Teo M Delrick Dehart, PA-C   BP 105/85 mmHg  Pulse 70  Temp(Src) 98.3 F (36.8 C) (Oral)  Resp 16  Ht  (1.88 m)  Wt 160 lb (72.576 kg)  BMI 20.53 kg/m2  SpO2 100% Physical Exam  Constitutional: He is oriented to person, place, and time. He appears well-developed and well-nourished. No distress.  HENT:  Head: Normocephalic and atraumatic.  Eyes: Conjunctivae and EOM are normal.  Neck: Normal range of motion. Neck supple.  Cardiovascular: Normal rate, regular rhythm and normal heart sounds.   Pulmonary/Chest: Effort normal and breath sounds normal.  Musculoskeletal: Normal range of motion. He exhibits no edema.  TTP right trapezius with spasm. Shoulder normal. C-spine normal.  Neurological: He is alert and oriented to person, place, and time.  Skin: Skin is warm and dry.     2 areas about 2 cm in diameter of erythematous, raised maculopapular rash on left lower back. No secondary infection. No tenderness.  Psychiatric: He has a normal mood and affect. His behavior is normal.  Nursing note and vitals reviewed.   ED Course  Procedures (including critical care time) Labs Review Labs Reviewed - No data to display  Imaging Review No results found. I, Celene Skeen, personally reviewed and  evaluated these images and lab results as part of my medical decision-making.   EKG Interpretation None      MDM   Final diagnoses:  Contact dermatitis  Trapezius muscle spasm   NAD. AFVSS. Rash is apparent contact dermatitis. No secondary infection. Treat trends: Cream. Regarding shoulder pain, has right trapezius muscle spasm. Advised rest, ice/heat, NSAIDs. Stable for discharge. Follow-up with PCP. Return precautions given. Patient states understanding of  treatment care plan and is agreeable.   Kathrynn Speed, PA-C 02/23/15 1629  Nelva Nay, MD 02/23/15 [phone]

## 2015-02-23 NOTE — ED Notes (Signed)
Denies any SOB , states just smoked a cigarette

## 2015-04-27 ENCOUNTER — Emergency Department (HOSPITAL_BASED_OUTPATIENT_CLINIC_OR_DEPARTMENT_OTHER)
Admission: EM | Admit: 2015-04-27 | Discharge: 2015-04-27 | Disposition: A | Discharge status: Home / Self Care | Payer: Medicaid Other | Attending: Emergency Medicine | Admitting: Emergency Medicine

## 2015-04-27 ENCOUNTER — Encounter (HOSPITAL_BASED_OUTPATIENT_CLINIC_OR_DEPARTMENT_OTHER): Payer: Self-pay

## 2015-04-27 DIAGNOSIS — F329 Major depressive disorder, single episode, unspecified: Secondary | ICD-10-CM | POA: Diagnosis not present

## 2015-04-27 DIAGNOSIS — Z7952 Long term (current) use of systemic steroids: Secondary | ICD-10-CM | POA: Diagnosis not present

## 2015-04-27 DIAGNOSIS — Z8619 Personal history of other infectious and parasitic diseases: Secondary | ICD-10-CM | POA: Insufficient documentation

## 2015-04-27 DIAGNOSIS — Z791 Long term (current) use of non-steroidal anti-inflammatories (NSAID): Secondary | ICD-10-CM | POA: Diagnosis not present

## 2015-04-27 DIAGNOSIS — Z720 Tobacco use: Secondary | ICD-10-CM | POA: Insufficient documentation

## 2015-04-27 DIAGNOSIS — Z202 Contact with and (suspected) exposure to infections with a predominantly sexual mode of transmission: Secondary | ICD-10-CM | POA: Insufficient documentation

## 2015-04-27 DIAGNOSIS — F250 Schizoaffective disorder, bipolar type: Secondary | ICD-10-CM | POA: Insufficient documentation

## 2015-04-27 DIAGNOSIS — R300 Dysuria: Secondary | ICD-10-CM | POA: Insufficient documentation

## 2015-04-27 LAB — URINALYSIS, ROUTINE W REFLEX MICROSCOPIC
Bilirubin Urine: NEGATIVE
GLUCOSE, UA: NEGATIVE mg/dL
Hgb urine dipstick: NEGATIVE
Ketones, ur: NEGATIVE mg/dL
LEUKOCYTES UA: NEGATIVE
Nitrite: NEGATIVE
Protein, ur: NEGATIVE mg/dL
SPECIFIC GRAVITY, URINE: 1.006 (ref 1.005–1.030)
Urobilinogen, UA: 0.2 mg/dL (ref 0.0–1.0)
pH: 7.5 (ref 5.0–8.0)

## 2015-04-27 MED ORDER — CEFTRIAXONE SODIUM 250 MG IJ SOLR
250.0000 mg | Freq: Once | INTRAMUSCULAR | Status: AC
  Administered 2015-04-27: 250 mg via INTRAMUSCULAR
  Filled 2015-04-27: qty 250

## 2015-04-27 MED ORDER — LIDOCAINE HCL (PF) 1 % IJ SOLN
INTRAMUSCULAR | Status: AC
  Administered 2015-04-27: 2.1 mL
  Filled 2015-04-27: qty 5

## 2015-04-27 MED ORDER — AZITHROMYCIN 250 MG PO TABS
1000.0000 mg | ORAL_TABLET | Freq: Once | ORAL | Status: AC
  Administered 2015-04-27: 1000 mg via ORAL
  Filled 2015-04-27: qty 4

## 2015-04-27 NOTE — ED Notes (Addendum)
Pt reports 2 days of dysuria, significant other + for Chlamydia, seeking treatment.

## 2015-04-27 NOTE — Discharge Instructions (Signed)
Sexually Transmitted Disease °A sexually transmitted disease (STD) is a disease or infection that may be passed (transmitted) from person to person, usually during sexual activity. This may happen by way of saliva, semen, blood, vaginal mucus, or urine. Common STDs include: °· Gonorrhea. °· Chlamydia. °· Syphilis. °· HIV and AIDS. °· Genital herpes. °· Hepatitis B and C. °· Trichomonas. °· Human papillomavirus (HPV). °· Pubic lice. °· Scabies. °· Mites. °· Bacterial vaginosis. °WHAT ARE CAUSES OF STDs? °An STD may be caused by bacteria, a virus, or parasites. STDs are often transmitted during sexual activity if one person is infected. However, they may also be transmitted through nonsexual means. STDs may be transmitted after:  °· Sexual intercourse with an infected person. °· Sharing sex toys with an infected person. °· Sharing needles with an infected person or using unclean piercing or tattoo needles. °· Having intimate contact with the genitals, mouth, or rectal areas of an infected person. °· Exposure to infected fluids during birth. °WHAT ARE THE SIGNS AND SYMPTOMS OF STDs? °Different STDs have different symptoms. Some people may not have any symptoms. If symptoms are present, they may include: °· Painful or bloody urination. °· Pain in the pelvis, abdomen, vagina, anus, throat, or eyes. °· A skin rash, itching, or irritation. °· Growths, ulcerations, blisters, or sores in the genital and anal areas. °· Abnormal vaginal discharge with or without bad odor. °· Penile discharge in men. °· Fever. °· Pain or bleeding during sexual intercourse. °· Swollen glands in the groin area. °· Yellow skin and eyes (jaundice). This is seen with hepatitis. °· Swollen testicles. °· Infertility. °· Sores and blisters in the mouth. °HOW ARE STDs DIAGNOSED? °To make a diagnosis, your health care provider may: °· Take a medical history. °· Perform a physical exam. °· Take a sample of any discharge to examine. °· Swab the throat,  cervix, opening to the penis, rectum, or vagina for testing. °· Test a sample of your first morning urine. °· Perform blood tests. °· Perform a Pap test, if this applies. °· Perform a colposcopy. °· Perform a laparoscopy. °HOW ARE STDs TREATED? °Treatment depends on the STD. Some STDs may be treated but not cured. °· Chlamydia, gonorrhea, trichomonas, and syphilis can be cured with antibiotic medicine. °· Genital herpes, hepatitis, and HIV can be treated, but not cured, with prescribed medicines. The medicines lessen symptoms. °· Genital warts from HPV can be treated with medicine or by freezing, burning (electrocautery), or surgery. Warts may come back. °· HPV cannot be cured with medicine or surgery. However, abnormal areas may be removed from the cervix, vagina, or vulva. °· If your diagnosis is confirmed, your recent sexual partners need treatment. This is true even if they are symptom-free or have a negative culture or evaluation. They should not have sex until their health care providers say it is okay. °· Your health care provider may test you for infection again 3 months after treatment. °HOW CAN I REDUCE MY RISK OF GETTING AN STD? °Take these steps to reduce your risk of getting an STD: °· Use latex condoms, dental dams, and water-soluble lubricants during sexual activity. Do not use petroleum jelly or oils. °· Avoid having multiple sex partners. °· Do not have sex with someone who has other sex partners °· Do not have sex with anyone you do not know or who is at high risk for an STD. °· Avoid risky sex practices that can break your skin. °· Do not have sex   if you have open sores on your mouth or skin. °· Avoid drinking too much alcohol or taking illegal drugs. Alcohol and drugs can affect your judgment and put you in a vulnerable position. °· Avoid engaging in oral and anal sex acts. °· Get vaccinated for HPV and hepatitis. If you have not received these vaccines in the past, talk to your health care  provider about whether one or both might be right for you. °· If you are at risk of being infected with HIV, it is recommended that you take a prescription medicine daily to prevent HIV infection. This is called pre-exposure prophylaxis (PrEP). You are considered at risk if: °¨ You are a man who has sex with other men (MSM). °¨ You are a heterosexual man or woman and are sexually active with more than one partner. °¨ You take drugs by injection. °¨ You are sexually active with a partner who has HIV. °· Talk with your health care provider about whether you are at high risk of being infected with HIV. If you choose to begin PrEP, you should first be tested for HIV. You should then be tested every 3 months for as long as you are taking PrEP. °WHAT SHOULD I DO IF I THINK I HAVE AN STD? °· See your health care provider. °· Tell your sexual partner(s). They should be tested and treated for any STDs. °· Do not have sex until your health care provider says it is okay. °WHEN SHOULD I GET IMMEDIATE MEDICAL CARE? °Contact your health care provider right away if:  °· You have severe abdominal pain. °· You are a man and notice swelling or pain in your testicles. °· You are a woman and notice swelling or pain in your vagina. °  °This information is not intended to replace advice given to you by your health care provider. Make sure you discuss any questions you have with your health care provider. °  °Document Released: 09/18/2002 Document Revised: 07/19/2014 Document Reviewed: 01/16/2013 °Elsevier Interactive Patient Education ©2016 [employer] ° °

## 2015-04-27 NOTE — ED Provider Notes (Signed)
CSN: [id number]     Arrival date & time 04/27/15  1637 History  By signing my name below, I, Budd Palmer, attest that this documentation has been prepared under the direction and in the presence of Glynn Octave, MD. Electronically Signed: Budd Palmer, ED Scribe. 04/27/2015. 4:56 PM.      Chief Complaint  Patient presents with  . Exposure to STD   The history is provided by the patient. No language interpreter was used.   HPI Comments: Tony Morris is a 34 y.o. male who presents to the Emergency Department complaining of an exposure to chlamydia that occurred 1 week ago. He notes his sexual partner told him today that she was diagnosed with chlamydia. He reports associated dysuria (1x). He notes he takes Seroquel and Lexapro for depression. He reports a PMHx of chlamydia, but states he did not have symptoms at that time. Pt denies testicular pain, penile discharge, abdominal pain, and fever.  Past Medical History  Diagnosis Date  . Schizo affective schizophrenia [facility])    Past Surgical History  Procedure Laterality Date  . Lumbar spine surgery     History reviewed. No pertinent family history. Social History  Substance Use Topics  . Smoking status: Current Every Day Smoker -- 0.50 packs/day    Types: Cigarettes  . Smokeless tobacco: None  . Alcohol Use: No    Review of Systems A complete 10 system review of systems was obtained and all systems are negative except as noted in the HPI and PMH.   Allergies  Review of patient's allergies indicates no known allergies.  Home Medications   Prior to Admission medications   Medication Sig Start Date End Date Taking? Authorizing Provider  escitalopram (LEXAPRO) 10 MG tablet Take by mouth daily.   Yes Historical Provider, MD  QUEtiapine (SEROQUEL) 100 MG tablet Take by mouth at bedtime.   Yes Historical Provider, MD  citalopram (CELEXA) 10 MG tablet Take 1 tablet (10 mg total) by mouth daily. 08/10/11 08/09/12  Benjiman Core, MD  ibuprofen (ADVIL,MOTRIN) 800 MG tablet Take 1 tablet (800 mg total) by mouth 3 (three) times daily. 02/23/15   Kathrynn Speed, PA-C  triamcinolone cream (KENALOG) 0.1 % Apply 1 application topically 2 (two) times daily. 02/23/15   Robyn M Hess, PA-C   BP 125/75 mmHg  Pulse 80  Temp(Src) 97.6 F (36.4 C) (Oral)  Resp 16  Ht  (1.88 m)  Wt 170 lb (77.111 kg)  BMI 21.82 kg/m2  SpO2 97% Physical Exam  Constitutional: He is oriented to person, place, and time. He appears well-developed and well-nourished. No distress.  HENT:  Head: Normocephalic and atraumatic.  Mouth/Throat: Oropharynx is clear and moist. No oropharyngeal exudate.  Eyes: Conjunctivae and EOM are normal. Pupils are equal, round, and reactive to light.  Neck: Normal range of motion. Neck supple.  No meningismus.  Cardiovascular: Normal rate, regular rhythm, normal heart sounds and intact distal pulses.   No murmur heard. Pulmonary/Chest: Effort normal and breath sounds normal. No respiratory distress.  Abdominal: Soft. There is no tenderness. There is no rebound and no guarding.  Genitourinary:  No testicular pain, no rashes, no discharge  Musculoskeletal: Normal range of motion. He exhibits no edema or tenderness.  Neurological: He is alert and oriented to person, place, and time. No cranial nerve deficit. He exhibits normal muscle tone. Coordination normal.  No ataxia on finger to nose bilaterally. No pronator drift. 5/5 strength throughout. CN 2-12 intact. Negative Romberg. Equal  grip strength. Sensation intact. Gait is normal.   Skin: Skin is warm. No rash noted.  Psychiatric: He has a normal mood and affect. His behavior is normal.  Nursing note and vitals reviewed.   ED Course  Procedures  DIAGNOSTIC STUDIES: Oxygen Saturation is 97% on RA, normal by my interpretation.    COORDINATION OF CARE: 4:54 PM - Discussed plans to treat for chlamydia and preventatively for other STDs as well. Pt advised  of plan for treatment and pt agrees.  Labs Review Labs Reviewed  URINALYSIS, ROUTINE W REFLEX [facility]CROSCOPIC [facility])  GC/CHLAMYDIA PROBE AMP ([facility]) NOT [facility]    Imaging Review No results found. I have personally reviewed and evaluated these images and lab results as part of my medical decision-making.   EKG Interpretation None      MDM   Final diagnoses:  STD exposure   Patient states burning with urination for the past 2 days. Told by his partner today she tested positive for chlamydia. He denies any discharge, fever, vomiting or testicular pain.  Normal exam. Urinalysis is negative.  Treat empirically for gonorrhea and chlamydia. Safe sex practices discussed. Follow up with health department.  I personally performed the services described in this documentation, which was scribed in my presence. The recorded information has been reviewed and is accurate.   Glynn OctaveStephen Lawren Sexson, MD 04/27/15 [phone]

## 2015-04-27 NOTE — ED Notes (Signed)
Pt directed to f/u with health dept

## 2015-04-28 LAB — GC/CHLAMYDIA PROBE AMP (~~LOC~~) NOT AT ARMC
CHLAMYDIA, DNA PROBE: NEGATIVE
Neisseria Gonorrhea: NEGATIVE
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
## 2016-03-03 ENCOUNTER — Emergency Department (HOSPITAL_BASED_OUTPATIENT_CLINIC_OR_DEPARTMENT_OTHER)
Admission: EM | Admit: 2016-03-03 | Discharge: 2016-03-03 | Disposition: A | Discharge status: Home / Self Care | Payer: Medicaid Other | Attending: Emergency Medicine | Admitting: Emergency Medicine

## 2016-03-03 ENCOUNTER — Encounter (HOSPITAL_BASED_OUTPATIENT_CLINIC_OR_DEPARTMENT_OTHER): Payer: Self-pay

## 2016-03-03 DIAGNOSIS — F17210 Nicotine dependence, cigarettes, uncomplicated: Secondary | ICD-10-CM | POA: Diagnosis not present

## 2016-03-03 DIAGNOSIS — L03211 Cellulitis of face: Secondary | ICD-10-CM | POA: Diagnosis not present

## 2016-03-03 DIAGNOSIS — R220 Localized swelling, mass and lump, head: Secondary | ICD-10-CM | POA: Diagnosis present

## 2016-03-03 MED ORDER — IBUPROFEN 600 MG PO TABS: 600.0000 mg | ORAL_TABLET | Freq: Four times a day (QID) | ORAL | 0 refills | Status: DC | PRN

## 2016-03-03 MED ORDER — SULFAMETHOXAZOLE-TRIMETHOPRIM 800-160 MG PO TABS: 1.0000 | ORAL_TABLET | Freq: Two times a day (BID) | ORAL | 0 refills | Status: AC

## 2016-03-03 MED ORDER — IBUPROFEN 400 MG PO TABS
600.0000 mg | ORAL_TABLET | Freq: Once | ORAL | Status: AC
  Administered 2016-03-03: 600 mg via ORAL
  Filled 2016-03-03: qty 1

## 2016-03-03 MED ORDER — SULFAMETHOXAZOLE-TRIMETHOPRIM 800-160 MG PO TABS
1.0000 | ORAL_TABLET | Freq: Once | ORAL | Status: AC
  Administered 2016-03-03: 1 via ORAL
  Filled 2016-03-03: qty 1

## 2016-03-03 MED FILL — SULFAMETHOXAZOLE-TMP DS TAB: 800-160 | 7 days supply | Qty: 14 | Fill #0

## 2016-03-03 MED FILL — IBUPROFEN 600 MG TABLET: 600 | 8 days supply | Qty: 30 | Fill #0

## 2016-03-03 NOTE — ED Notes (Signed)
MD at bedside assessing pt with bedside ultrasound

## 2016-03-03 NOTE — ED Notes (Signed)
Pt directed to [facility] pharmacy to pick up prescriptions

## 2016-03-03 NOTE — ED Provider Notes (Signed)
[facility]   CSN: [id number] Arrival date & time: 03/03/16  1229     History   Chief Complaint Chief Complaint  Patient presents with  . Facial Swelling    HPI Tony Morris is a 35 y.o. male.  HPI Patient presents with right lower facial swelling and pain after squeezing pimple 3 days ago. Denies any fever or chills. No current discharge from the site. No intraoral swelling. Past Medical History:  Diagnosis Date  . Schizo affective schizophrenia (HCC)     There are no active problems to display for this patient.   Past Surgical History:  Procedure Laterality Date  . LUMBAR SPINE SURGERY         Home Medications    Prior to Admission medications   Medication Sig Start Date End Date Taking? Authorizing Provider  escitalopram (LEXAPRO) 10 MG tablet Take by mouth daily.    Historical Provider, MD  ibuprofen (ADVIL,MOTRIN) 600 MG tablet Take 1 tablet (600 mg total) by mouth every 6 (six) hours as needed. 03/03/16   Loren Raceravid Simya Tercero, MD  QUEtiapine (SEROQUEL) 100 MG tablet Take by mouth at bedtime.    Historical Provider, MD  sulfamethoxazole-trimethoprim (BACTRIM DS,SEPTRA DS) 800-160 MG tablet Take 1 tablet by mouth 2 (two) times daily. 03/03/16 03/10/16  Loren Raceravid Dashay Giesler, MD    Family History No family history on file.  Social History Social History  Substance Use Topics  . Smoking status: Current Every Day Smoker    Packs/day: 0.50    Types: Cigarettes  . Smokeless tobacco: Never Used  . Alcohol use No     Allergies   Review of patient's allergies indicates no known allergies.   Review of Systems Review of Systems  Constitutional: Negative for chills and fever.  HENT: Positive for facial swelling. Negative for dental problem and sore throat.   Respiratory: Negative for shortness of breath and stridor.   Skin: Positive for rash.  All other systems reviewed and are negative.    Physical Exam Updated Vital Signs BP  117/82 (BP Location: Left Arm)   Pulse 74   Temp 98.2 F (36.8 C) (Oral)   Resp 18   Ht 6\' 2"  (1.88 m)   Wt 156 lb (70.8 kg)   SpO2 98%   BMI 20.03 kg/m   Physical Exam  Constitutional: He is oriented to person, place, and time. He appears well-developed and well-nourished.  HENT:  Head: Normocephalic and atraumatic.  Mouth/Throat: Oropharynx is clear and moist.  Diffuse swelling to the right lower face. There is an area of induration and erythema 2-3 cm in diameter just lateral to the right side of the mouth. No fluctuance is appreciated. No intraoral swelling or palpable masses.  Eyes: EOM are normal. Pupils are equal, round, and reactive to light.  Neck: Normal range of motion. Neck supple.  Cardiovascular: Normal rate.   Pulmonary/Chest: Effort normal.  Abdominal: Soft.  Musculoskeletal: Normal range of motion. He exhibits no edema or tenderness.  Lymphadenopathy:    He has no cervical adenopathy.  Neurological: He is alert and oriented to person, place, and time.  Skin: Skin is warm and dry. Capillary refill takes less than 2 seconds. No rash noted. There is erythema.  Psychiatric: He has a normal mood and affect. His behavior is normal.  Nursing note and vitals reviewed.    ED Treatments / Results  Labs (all labs ordered are listed, but only abnormal results are displayed) Labs Reviewed - No data  to display  EKG  EKG Interpretation None       Radiology No results found.  Procedures Procedures (including critical care time)  Medications Ordered in ED Medications  ibuprofen (ADVIL,MOTRIN) tablet 600 mg (not administered)  sulfamethoxazole-trimethoprim (BACTRIM DS,SEPTRA DS) 800-160 MG per tablet 1 tablet (not administered)     Initial Impression / Assessment and Plan / ED Course  I have reviewed the triage vital signs and the nursing notes.  Pertinent labs & imaging results that were available during my care of the patient were reviewed by me and  considered in my medical decision making (see chart for details).  Clinical Course    Bedside ultrasound with no evidence of drainable abscess. We'll start on antibiotics for facial cellulitis. Patient very well-appearing. He's been advised to return immediately for any worsening of the swelling, fever or for any concerns. Given first dose of antibiotics in the emergency department.  Final Clinical Impressions(s) / ED Diagnoses   Final diagnoses:  Facial cellulitis    New Prescriptions New Prescriptions   IBUPROFEN (ADVIL,MOTRIN) 600 MG TABLET    Take 1 tablet (600 mg total) by mouth every 6 (six) hours as needed.   SULFAMETHOXAZOLE-TRIMETHOPRIM (BACTRIM DS,SEPTRA DS) 800-160 MG TABLET    Take 1 tablet by mouth 2 (two) times daily.     Loren Raceravid Garnetta Fedrick, MD 03/03/16 1310

## 2016-03-03 NOTE — ED Triage Notes (Addendum)
Swelling to right side of face x 2 days-pt states he "popped a bump" to right side of face 3 days ago-also just completed abx from having 5 teeth pulled-NAD-steady gait

## 2016-05-05 ENCOUNTER — Emergency Department (HOSPITAL_BASED_OUTPATIENT_CLINIC_OR_DEPARTMENT_OTHER): Payer: Medicaid Other

## 2016-05-05 ENCOUNTER — Emergency Department (HOSPITAL_BASED_OUTPATIENT_CLINIC_OR_DEPARTMENT_OTHER)
Admission: EM | Admit: 2016-05-05 | Discharge: 2016-05-05 | Disposition: A | Discharge status: Home / Self Care | Payer: Medicaid Other | Attending: Emergency Medicine | Admitting: Emergency Medicine

## 2016-05-05 ENCOUNTER — Encounter (HOSPITAL_BASED_OUTPATIENT_CLINIC_OR_DEPARTMENT_OTHER): Payer: Self-pay

## 2016-05-05 DIAGNOSIS — F17210 Nicotine dependence, cigarettes, uncomplicated: Secondary | ICD-10-CM | POA: Insufficient documentation

## 2016-05-05 DIAGNOSIS — Z79899 Other long term (current) drug therapy: Secondary | ICD-10-CM | POA: Insufficient documentation

## 2016-05-05 DIAGNOSIS — M545 Low back pain, unspecified: Secondary | ICD-10-CM

## 2016-05-05 LAB — URINALYSIS, ROUTINE W REFLEX MICROSCOPIC
Bilirubin Urine: NEGATIVE
GLUCOSE, UA: NEGATIVE mg/dL
Hgb urine dipstick: NEGATIVE
Ketones, ur: NEGATIVE mg/dL
Leukocytes, UA: NEGATIVE
Nitrite: NEGATIVE
Protein, ur: NEGATIVE mg/dL
SPECIFIC GRAVITY, URINE: 1.022 (ref 1.005–1.030)
pH: 6 (ref 5.0–8.0)

## 2016-05-05 MED ORDER — CYCLOBENZAPRINE HCL 10 MG PO TABS: 10.0000 mg | ORAL_TABLET | Freq: Two times a day (BID) | ORAL | 0 refills | Status: DC | PRN

## 2016-05-05 MED ORDER — TRAMADOL HCL 50 MG PO TABS
50.0000 mg | ORAL_TABLET | Freq: Once | ORAL | Status: AC
  Administered 2016-05-05: 50 mg via ORAL
  Filled 2016-05-05: qty 1

## 2016-05-05 MED ORDER — NAPROXEN 375 MG PO TABS: 375.0000 mg | ORAL_TABLET | Freq: Two times a day (BID) | ORAL | 0 refills | Status: DC

## 2016-05-05 MED FILL — NAPROXEN 375 MG TABLET: 375 | 6 days supply | Qty: 12 | Fill #0

## 2016-05-05 MED FILL — CYCLOBENZAPRINE 10 MG TAB: 10 | 7 days supply | Qty: 15 | Fill #0

## 2016-05-05 NOTE — ED Notes (Signed)
Pt. returned from XR. 

## 2016-05-05 NOTE — ED Provider Notes (Signed)
[facility] Provider [facility]   CSN: [id number] Arrival date & time: 05/05/16  1216     History   Chief Complaint Chief Complaint  Patient presents with  . Back Pain    HPI Tony Morris is a 35 y.o. male.  35 year old African-American male past medical history significant for lumbar spine surgery approximately 15 years ago presents to the ED with lower back pain. Patient states pain started yesterday. Going from a sitting to standing position makes the pain worse. Once he sits down or stands up the pain resolves. Patient denies any new trauma to lower back. States that he thinks it might be the mattress he is sleeping on. He has not tried anything at home for the pain. Patient states that the change in weather makes his joints ache worse. Patient has been ambulatory with a steady gait. He denies any fever, saddle paresthesia, urinary retention, loss of bowel or bladder, numbness/tingling in lower extremities, history of cancer, IVDU of diffuse. Patient states that he has not had any problems with his back since the surgery 15 years ago. Patient denies any fever, chills, headache, vision changes, neck pain, lightheadedness, dizziness, chest pain, shortness of breath, abdominal pain, emesis, nausea, urinary symptoms, change in bowel habits, numbness/tingling.      Past Medical History:  Diagnosis Date  . Schizo affective schizophrenia (HCC)     There are no active problems to display for this patient.   Past Surgical History:  Procedure Laterality Date  . LUMBAR SPINE SURGERY         Home Medications    Prior to Admission medications   Medication Sig Start Date End Date Taking? Authorizing Provider  escitalopram (LEXAPRO) 10 MG tablet Take by mouth daily.    Historical Provider, MD  ibuprofen (ADVIL,MOTRIN) 600 MG tablet Take 1 tablet (600 mg total) by mouth every 6 (six) hours as needed. 03/03/16   Loren Racer, MD  QUEtiapine (SEROQUEL) 100 MG tablet Take  by mouth at bedtime.    Historical Provider, MD    Family History No family history on file.  Social History Social History  Substance Use Topics  . Smoking status: Current Every Day Smoker    Packs/day: 0.50    Types: Cigarettes  . Smokeless tobacco: Never Used  . Alcohol use No     Allergies   Review of patient's allergies indicates no known allergies.   Review of Systems Review of Systems  Constitutional: Negative for chills and fever.  Musculoskeletal: Positive for arthralgias and back pain. Negative for gait problem, joint swelling and neck pain.  Skin: Negative for color change.  Neurological: Negative for dizziness, weakness, light-headedness and headaches.  All other systems reviewed and are negative.    Physical Exam Updated Vital Signs BP 123/84 (BP Location: Right Arm)   Pulse 86   Temp 98.1 F (36.7 C) (Oral)   Resp 18   Ht 6\' 2"  (1.88 m)   Wt 76.2 kg   SpO2 99%   BMI 21.57 kg/m   Physical Exam  Constitutional: He is oriented to person, place, and time. He appears well-developed and well-nourished. No distress.  HENT:  Head: Normocephalic and atraumatic.  Eyes: Pupils are equal, round, and reactive to light. Right eye exhibits no discharge. Left eye exhibits no discharge. No scleral icterus.  Neck: Normal range of motion. Neck supple.  Cardiovascular:  Pulses:      Radial pulses are 2+ on the right side, and 2+ on the left  side.       Dorsalis pedis pulses are 2+ on the right side, and 2+ on the left side.  Pulmonary/Chest: No respiratory distress.  Abdominal: There is no CVA tenderness.  Musculoskeletal: Normal range of motion.  Patient with midline surgical scar in the lumbar region. No signs of edema or erythema concerning for abscess. No midline tenderness. Full range of motion. Left paraspinal muscle tenderness going from sitting to standing. Tense muscular noted. Negative straight leg raise. No groin numbness noted.  Neurological: He is  alert and oriented to person, place, and time. He has normal reflexes.  Speech is clear and goal oriented, follows commands Normal 5/5 strength in upper and lower extremities bilaterally including dorsiflexion and plantar flexion, strong and equal grip strength Sensation normal to light and sharp touch Moves extremities without ataxia, coordination intact Normal gait Normal balance No Clonus  Skin: Skin is warm and dry. Capillary refill takes less than 2 seconds. No pallor.  Nursing note and vitals reviewed.     ED Treatments / Results  Labs (all labs ordered are listed, but only abnormal results are displayed) Labs Reviewed  URINALYSIS, ROUTINE W REFLEX MICROSCOPIC (NOT AT [facility])    EKG  EKG Interpretation None       Radiology Dg Lumbar Spine Complete  Result Date: 05/05/2016 CLINICAL DATA:  Low back pain when getting up and down. No known injury. Initial encounter. EXAM: LUMBAR SPINE - COMPLETE 4+ VIEW COMPARISON:  None. FINDINGS: There is no evidence of lumbar spine fracture. Alignment is normal. Intervertebral disc spaces are maintained. IMPRESSION: Negative exam. Electronically Signed   By: Drusilla Kannerhomas  Dalessio M.D.   On: 05/05/2016 13:31    Procedures Procedures (including critical care time)  Medications Ordered in ED Medications  traMADol (ULTRAM) tablet 50 mg (not administered)     Initial Impression / Assessment and Plan / ED Course  I have reviewed the triage vital signs and the nursing notes.  Pertinent labs & imaging results that were available during my care of the patient were reviewed by me and considered in my medical decision making (see chart for details).  Clinical Course  Patient with back pain.  No neurological deficits and normal neuro exam.  Patient can walk without pain.  No loss of bowel or bladder control.  No concern for cauda equina.  No fever, night sweats, weight loss, h/o cancer, IVDU.  RICE protocol and pain medicine indicated and  discussed with patient. I encouraged follow-up with primary care doctor for further imaging and evaluation by specialist. Strict return precautions given. Patient verbalized understanding with plan of care. Hemodynamically stable and discharged home in NAD with stable vs.    Final Clinical Impressions(s) / ED Diagnoses   Final diagnoses:  Acute left-sided low back pain without sciatica    New Prescriptions Discharge Medication List as of 05/05/2016  2:12 PM    START taking these medications   Details  cyclobenzaprine (FLEXERIL) 10 MG tablet Take 1 tablet (10 mg total) by mouth 2 (two) times daily as needed for muscle spasms., Starting Wed 05/05/2016, Print    naproxen (NAPROSYN) 375 MG tablet Take 1 tablet (375 mg total) by mouth 2 (two) times daily., Starting Wed 05/05/2016, Print         Rise MuKenneth T Leaphart, PA-C 05/05/16 1443    Alvira MondayErin Schlossman, MD 05/06/16 [phone]

## 2016-05-05 NOTE — ED Triage Notes (Signed)
C/o lower back pain-started yesterday-denies injury-NAD-steady gait 

## 2016-05-05 NOTE — ED Notes (Signed)
Pt unable to be located at this time

## 2016-05-05 NOTE — Discharge Instructions (Signed)
Please take naproxen as needed for pain. He may also take Tylenol as needed. Take Flexeril for muscle relaxation. Please do not drive with Flexeril. Use a heating pad in her lower back and nightly sleep. Please follow-up with your primary care doctor if her symptoms not improve in the next few days. Return to the ED if he develop any worsening symptoms or loose control of bowel or bladder, or unable to urinate, developed numbness in her groin.

## 2017-10-10 ENCOUNTER — Encounter (HOSPITAL_BASED_OUTPATIENT_CLINIC_OR_DEPARTMENT_OTHER): Payer: Self-pay | Admitting: Emergency Medicine

## 2017-10-10 ENCOUNTER — Emergency Department (HOSPITAL_BASED_OUTPATIENT_CLINIC_OR_DEPARTMENT_OTHER)
Admission: EM | Admit: 2017-10-10 | Discharge: 2017-10-10 | Disposition: A | Discharge status: Home / Self Care | Payer: Medicaid Other | Attending: Emergency Medicine | Admitting: Emergency Medicine

## 2017-10-10 ENCOUNTER — Other Ambulatory Visit: Payer: Self-pay

## 2017-10-10 DIAGNOSIS — Z79899 Other long term (current) drug therapy: Secondary | ICD-10-CM | POA: Diagnosis not present

## 2017-10-10 DIAGNOSIS — L0231 Cutaneous abscess of buttock: Secondary | ICD-10-CM | POA: Insufficient documentation

## 2017-10-10 DIAGNOSIS — L03317 Cellulitis of buttock: Secondary | ICD-10-CM | POA: Diagnosis not present

## 2017-10-10 DIAGNOSIS — F17210 Nicotine dependence, cigarettes, uncomplicated: Secondary | ICD-10-CM | POA: Diagnosis not present

## 2017-10-10 MED ORDER — CEPHALEXIN 500 MG PO CAPS: 500.0000 mg | ORAL_CAPSULE | Freq: Four times a day (QID) | ORAL | 0 refills | Status: AC

## 2017-10-10 MED ORDER — LIDOCAINE-EPINEPHRINE 2 %-1:100000 IJ SOLN: 20.0000 mL | Freq: Once | INTRAMUSCULAR | Status: DC

## 2017-10-10 MED ORDER — LIDOCAINE-EPINEPHRINE 1 %-1:100000 IJ SOLN
INTRAMUSCULAR | Status: AC
  Filled 2017-10-10: qty 1

## 2017-10-10 MED ORDER — CEPHALEXIN 250 MG PO CAPS
500.0000 mg | ORAL_CAPSULE | Freq: Once | ORAL | Status: AC
  Administered 2017-10-10: 500 mg via ORAL
  Filled 2017-10-10: qty 2

## 2017-10-10 MED FILL — CEPHALEXIN 500 MG CAPSULE: 500 | 7 days supply | Qty: 28 | Fill #0

## 2017-10-10 NOTE — ED Notes (Signed)
ED Provider at bedside. 

## 2017-10-10 NOTE — Discharge Instructions (Signed)
Your exam today was consistent with a cellulitis with abscess pocket.  We drained the abscess in the emergency department.  We will continue to drain.  Please keep the area dry so it can drain and take the antibiotics as prescribed.  Please follow-up with a primary doctor in the next few days for reassessment.  If any symptoms change or worsen, please return to the nearest emergency department.

## 2017-10-10 NOTE — ED Triage Notes (Signed)
Patient reports abscess to buttock since yesterday.  Denies drainage, fevers.

## 2017-10-10 NOTE — ED Provider Notes (Signed)
[facility] EMERGENCY DEPARTMENT Provider Note   CSN: [id number] Arrival date & time: 10/10/17  4098     History   Chief Complaint Chief Complaint  Patient presents with  . Abscess    HPI Tony Morris is a 37 y.o. male.  The history is provided by the patient and medical records.  Abscess  Location:  Pelvis Pelvic abscess location:  R buttock Size:  1 Abscess quality: fluctuance, induration, painful, redness and warmth   Red streaking: no   Duration:  2 days Progression:  Worsening Pain details:    Quality:  Dull and sharp   Severity:  Severe   Duration:  2 days   Timing:  Constant   Progression:  Worsening Chronicity:  New Context: not diabetes, not immunosuppression, not injected drug use and not skin injury   Relieved by:  Nothing Exacerbated by: sitting. Ineffective treatments:  None tried Associated symptoms: no fatigue, no fever, no headaches, no nausea and no vomiting   Risk factors: prior abscess     Past Medical History:  Diagnosis Date  . Schizo affective schizophrenia (HCC)     There are no active problems to display for this patient.   Past Surgical History:  Procedure Laterality Date  . LUMBAR SPINE SURGERY          Home Medications    Prior to Admission medications   Medication Sig Start Date End Date Taking? Authorizing Provider  cyclobenzaprine (FLEXERIL) 10 MG tablet Take 1 tablet (10 mg total) by mouth 2 (two) times daily as needed for muscle spasms. 05/05/16   Demetrios Loll T, PA-C  escitalopram (LEXAPRO) 10 MG tablet Take by mouth daily.    [provider]  ibuprofen (ADVIL,MOTRIN) 600 MG tablet Take 1 tablet (600 mg total) by mouth every 6 (six) hours as needed. 03/03/16   Loren Racer, MD  naproxen (NAPROSYN) 375 MG tablet Take 1 tablet (375 mg total) by mouth 2 (two) times daily. 05/05/16   Rise Mu, PA-C  QUEtiapine (SEROQUEL) 100 MG tablet Take by mouth at bedtime.    [provider]    Family History History reviewed. No pertinent family history.  Social History Social History   Tobacco Use  . Smoking status: Current Every Day Smoker    Packs/day: 0.50    Types: Cigarettes  . Smokeless tobacco: Never Used  Substance Use Topics  . Alcohol use: No    Alcohol/week: 0.0 oz  . Drug use: No     Allergies   Patient has no known allergies.   Review of Systems Review of Systems  Constitutional: Negative for chills, diaphoresis, fatigue and fever.  HENT: Negative for congestion.   Respiratory: Negative for cough, chest tightness, shortness of breath, wheezing and stridor.   Cardiovascular: Negative for chest pain and palpitations.  Gastrointestinal: Negative for abdominal pain, constipation, diarrhea, nausea and vomiting.  Genitourinary: Negative for dysuria and flank pain.  Musculoskeletal: Negative for back pain, neck pain and neck stiffness.  Skin: Positive for color change.  Neurological: Negative for light-headedness and headaches.  Psychiatric/Behavioral: Negative for agitation.  All other systems reviewed and are negative.    Physical Exam Updated Vital Signs BP 132/82   Pulse 98   Temp 98.7 F (37.1 C) (Oral)   Resp 18   Ht 6\' 2"  (1.88 m)   Wt 72.6 kg (160 lb)   SpO2 100%   BMI 20.54 kg/m   Physical Exam  Constitutional: He is oriented to  person, place, and time. He appears well-developed and well-nourished. No distress.  HENT:  Head: Normocephalic.  Mouth/Throat: Oropharynx is clear and moist.  Eyes: Pupils are equal, round, and reactive to light. Conjunctivae and EOM are normal.  Neck: Normal range of motion.  Cardiovascular: Normal rate.  No murmur heard. Pulmonary/Chest: Effort normal and breath sounds normal. No respiratory distress.  Abdominal: Soft. There is no tenderness.  Genitourinary:     Musculoskeletal: He exhibits tenderness. He exhibits no deformity.  Neurological: He is alert and oriented to  person, place, and time.  Skin: Capillary refill takes less than 2 seconds. He is not diaphoretic. There is erythema.  Psychiatric: He has a normal mood and affect.  Nursing note and vitals reviewed.    ED Treatments / Results  Labs (all labs ordered are listed, but only abnormal results are displayed) Labs Reviewed - No data to display  EKG None  Radiology No results found.  Procedures .Marland Kitchen.Incision and Drainage Date/Time: 10/10/2017 8:53 AM Performed by: Heide Scalesegeler, Christopher J, MD Authorized by: Heide Scalesegeler, Christopher J, MD   Consent:    Consent obtained:  Verbal   Consent given by:  Patient   Risks discussed:  Bleeding, infection, pain and incomplete drainage   Alternatives discussed:  No treatment Location:    Type:  Abscess   Size:  1cm   Location:  Anogenital   Anogenital location:  rbuttock. Pre-procedure details:    Skin preparation:  Chloraprep Anesthesia (see MAR for exact dosages):    Anesthesia method:  Local infiltration   Local anesthetic:  Lidocaine 1% WITH epi Procedure type:    Complexity:  Simple Procedure details:    Incision types:  Single straight   Incision depth:  Submucosal   Scalpel blade:  11   Wound management:  Probed and deloculated, irrigated with saline and extensive cleaning   Drainage:  Purulent   Drainage amount:  Moderate   Wound treatment:  Wound left open   Packing materials:  None Post-procedure details:    Patient tolerance of procedure:  Tolerated well, no immediate complications   (including critical care time)  EMERGENCY DEPARTMENT US SOFT TISSUE INTERPRETATION "Study: Limited Soft Tissue Ultrasound"  INDICATIONS: Soft tissue infection Multiple views of the body part were obtained in real-time with a multi-frequency linear probe  PERFORMED BY: Myself IMAGES ARCHIVED?: Yes SIDE:Right  BODY PART:buttock INTERPRETATION:  Abcess present   Medications Ordered in ED Medications  lidocaine-EPINEPHrine (XYLOCAINE W/EPI)  2 %-1:100000 (with pres) injection 20 mL (has no administration in time range)  lidocaine-EPINEPHrine (XYLOCAINE W/EPI) 1 %-1:100000 (with pres) injection (has no administration in time range)  cephALEXin (KEFLEX) capsule 500 mg (has no administration in time range)     Initial Impression / Assessment and Plan / ED Course  I have reviewed the triage vital signs and the nursing notes.  Pertinent labs & imaging results that were available during my care of the patient were reviewed by me and considered in my medical decision making (see chart for details).     Doran ClayWilliam J Zehner is a 37 y.o. male with no significant past medical history who presents with a right buttock pain concerning for abscess.  Patient reports that yesterday he began having pain with sitting and is concerned he has a knot and infection on his right buttock.  He reports that he has had a skin infection in the past but never needed drainage.  He denies any other complaints including no fevers, chills, nausea, vomiting, const patient,  diarrhea, or any other symptoms.  He denies any sick contacts.  He reports the pain is severe when sitting but otherwise is moderate.  On exam, patient found to have a area of tenderness, erythema, and swelling on the right buttock.  No involvement of the rectum on exam.  No surrounding crepitance or red streaking.  Abdomen nontender, lungs clear.  Chest nontender.  Back nontender.    Ultrasound was utilized revealing an abscess present.  Surrounding cellulitis also seen.  Incision and drainage was performed.  A moderate amount of purulence was removed.  Wound was left open.  Given the surrounding cellulitis and the amount of infection found, patient will be prescribed Keflex at discharge.  Patient will follow up with PCP in several days.  Patient understood return precautions for worsening infection and new symptoms.  Patient had no other questions or concerns and was discharged in good  condition.   Final Clinical Impressions(s) / ED Diagnoses   Final diagnoses:  Abscess of buttock, right  Cellulitis of buttock    ED Discharge Orders        Ordered    cephALEXin (KEFLEX) 500 MG capsule  4 times daily     10/10/17 0851      Clinical Impression: 1. Abscess of buttock, right   2. Cellulitis of buttock     Disposition: Discharge  Condition: Good  I have discussed the results, Dx and Tx plan with the pt(& family if present). He/she/they expressed understanding and agree(s) with the plan. Discharge instructions discussed at great length. Strict return precautions discussed and pt &/or family have verbalized understanding of the instructions. No further questions at time of discharge.    New Prescriptions   CEPHALEXIN (KEFLEX) 500 MG CAPSULE    Take 1 capsule (500 mg total) by mouth 4 (four) times daily for 7 days.    Follow Up: [facility] [address] [place] [address] [phone] Schedule an appointment as soon as possible for a visit    [facility] [place] [facility] [address] [id number] [address] [place] [address] [phone]       Tegeler, Canary Brim, MD 10/10/17 [phone]

## 2018-03-12 ENCOUNTER — Other Ambulatory Visit: Payer: Self-pay

## 2018-03-12 ENCOUNTER — Encounter (HOSPITAL_BASED_OUTPATIENT_CLINIC_OR_DEPARTMENT_OTHER): Payer: Self-pay | Admitting: *Deleted

## 2018-03-12 DIAGNOSIS — Y939 Activity, unspecified: Secondary | ICD-10-CM | POA: Diagnosis not present

## 2018-03-12 DIAGNOSIS — Y92410 Unspecified street and highway as the place of occurrence of the external cause: Secondary | ICD-10-CM | POA: Insufficient documentation

## 2018-03-12 DIAGNOSIS — Y999 Unspecified external cause status: Secondary | ICD-10-CM | POA: Insufficient documentation

## 2018-03-12 DIAGNOSIS — Z79899 Other long term (current) drug therapy: Secondary | ICD-10-CM | POA: Diagnosis not present

## 2018-03-12 DIAGNOSIS — F17210 Nicotine dependence, cigarettes, uncomplicated: Secondary | ICD-10-CM | POA: Insufficient documentation

## 2018-03-12 DIAGNOSIS — S161XXA Strain of muscle, fascia and tendon at neck level, initial encounter: Secondary | ICD-10-CM | POA: Insufficient documentation

## 2018-03-12 DIAGNOSIS — S199XXA Unspecified injury of neck, initial encounter: Secondary | ICD-10-CM | POA: Diagnosis present
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
## 2018-03-13 ENCOUNTER — Emergency Department (HOSPITAL_BASED_OUTPATIENT_CLINIC_OR_DEPARTMENT_OTHER)
Admission: EM | Admit: 2018-03-13 | Discharge: 2018-03-13 | Disposition: A | Discharge status: Home / Self Care | Payer: Medicaid Other | Attending: Emergency Medicine | Admitting: Emergency Medicine

## 2018-03-13 DIAGNOSIS — S161XXA Strain of muscle, fascia and tendon at neck level, initial encounter: Secondary | ICD-10-CM

## 2018-03-13 MED ORDER — NAPROXEN 500 MG PO TABS: 500.0000 mg | ORAL_TABLET | Freq: Two times a day (BID) | ORAL | 0 refills | Status: DC

## 2018-03-13 MED ORDER — KETOROLAC TROMETHAMINE 30 MG/ML IJ SOLN
30.0000 mg | Freq: Once | INTRAMUSCULAR | Status: AC
  Administered 2018-03-13: 30 mg via INTRAMUSCULAR
  Filled 2018-03-13: qty 1

## 2018-03-13 MED ORDER — CYCLOBENZAPRINE HCL 5 MG PO TABS
5.0000 mg | ORAL_TABLET | Freq: Once | ORAL | Status: DC
  Filled 2018-03-13: qty 1

## 2018-03-13 MED ORDER — CYCLOBENZAPRINE HCL 5 MG PO TABS: 5.0000 mg | ORAL_TABLET | Freq: Two times a day (BID) | ORAL | 0 refills | Status: DC | PRN
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
## 2018-08-17 ENCOUNTER — Emergency Department (HOSPITAL_BASED_OUTPATIENT_CLINIC_OR_DEPARTMENT_OTHER)
Admission: EM | Admit: 2018-08-17 | Discharge: 2018-08-17 | Disposition: A | Discharge status: Home / Self Care | Payer: Medicaid Other | Attending: Emergency Medicine | Admitting: Emergency Medicine

## 2018-08-17 ENCOUNTER — Encounter (HOSPITAL_BASED_OUTPATIENT_CLINIC_OR_DEPARTMENT_OTHER): Payer: Self-pay | Admitting: *Deleted

## 2018-08-17 ENCOUNTER — Other Ambulatory Visit: Payer: Self-pay

## 2018-08-17 DIAGNOSIS — L03317 Cellulitis of buttock: Secondary | ICD-10-CM | POA: Diagnosis not present

## 2018-08-17 DIAGNOSIS — F259 Schizoaffective disorder, unspecified: Secondary | ICD-10-CM | POA: Insufficient documentation

## 2018-08-17 DIAGNOSIS — F17210 Nicotine dependence, cigarettes, uncomplicated: Secondary | ICD-10-CM | POA: Diagnosis not present

## 2018-08-17 DIAGNOSIS — Z79899 Other long term (current) drug therapy: Secondary | ICD-10-CM | POA: Diagnosis not present

## 2018-08-17 DIAGNOSIS — R222 Localized swelling, mass and lump, trunk: Secondary | ICD-10-CM | POA: Diagnosis present

## 2018-08-17 MED ORDER — SULFAMETHOXAZOLE-TRIMETHOPRIM 800-160 MG PO TABS
1.0000 | ORAL_TABLET | Freq: Once | ORAL | Status: AC
  Administered 2018-08-17: 1 via ORAL
  Filled 2018-08-17: qty 1

## 2018-08-17 MED ORDER — SULFAMETHOXAZOLE-TRIMETHOPRIM 800-160 MG PO TABS: 1.0000 | ORAL_TABLET | Freq: Two times a day (BID) | ORAL | 0 refills | Status: DC

## 2018-08-17 MED ORDER — IBUPROFEN 800 MG PO TABS
800.0000 mg | ORAL_TABLET | Freq: Once | ORAL | Status: AC
  Administered 2018-08-17: 800 mg via ORAL
  Filled 2018-08-17: qty 1

## 2018-08-17 MED ORDER — SULFAMETHOXAZOLE-TRIMETHOPRIM 800-160 MG PO TABS: 1.0000 | ORAL_TABLET | Freq: Two times a day (BID) | ORAL | 0 refills | Status: AC

## 2018-08-17 MED ORDER — IBUPROFEN 800 MG PO TABS: 800.0000 mg | ORAL_TABLET | Freq: Three times a day (TID) | ORAL | 0 refills | Status: DC

## 2018-08-17 NOTE — ED Provider Notes (Signed)
[facility] EMERGENCY DEPARTMENT Provider Note   CSN: [id number] Arrival date & time: 08/17/18  2000     History   Chief Complaint Chief Complaint  Patient presents with  . Abscess    HPI Tony Morris is a 38 y.o. male.  The history is provided by the patient.  Abscess  Location:  Pelvis Pelvic abscess location:  R buttock Abscess quality: draining, painful and redness   Red streaking: no   Duration:  2 days Progression:  Partially resolved Pain details:    Quality:  Dull   Severity:  Moderate   Timing:  Constant   Progression:  Unchanged Chronicity:  Recurrent Context: not diabetes   Relieved by:  Nothing Worsened by:  Nothing Ineffective treatments:  Draining/squeezing Associated symptoms: no fever and no vomiting   Risk factors: prior abscess     Past Medical History:  Diagnosis Date  . Schizo affective schizophrenia (HCC)     There are no active problems to display for this patient.   Past Surgical History:  Procedure Laterality Date  . LUMBAR SPINE SURGERY          Home Medications    Prior to Admission medications   Medication Sig Start Date End Date Taking? Authorizing Provider  cyclobenzaprine (FLEXERIL) 10 MG tablet Take 1 tablet (10 mg total) by mouth 2 (two) times daily as needed for muscle spasms. 05/05/16   Rise MuLeaphart, Kenneth T, PA-C  cyclobenzaprine (FLEXERIL) 5 MG tablet Take 1 tablet (5 mg total) by mouth 2 (two) times daily as needed for muscle spasms. 03/13/18   Horton, Mayer Maskerourtney F, MD  escitalopram (LEXAPRO) 10 MG tablet Take by mouth daily.    [provider]  ibuprofen (ADVIL,MOTRIN) 600 MG tablet Take 1 tablet (600 mg total) by mouth every 6 (six) hours as needed. 03/03/16   Loren RacerYelverton, David, MD  naproxen (NAPROSYN) 375 MG tablet Take 1 tablet (375 mg total) by mouth 2 (two) times daily. 05/05/16   Rise MuLeaphart, Kenneth T, PA-C  naproxen (NAPROSYN) 500 MG tablet Take 1 tablet (500 mg total) by mouth 2 (two) times  daily. 03/13/18   Horton, Mayer Maskerourtney F, MD  QUEtiapine (SEROQUEL) 100 MG tablet Take by mouth at bedtime.    [provider]    Family History No family history on file.  Social History Social History   Tobacco Use  . Smoking status: Current Every Day Smoker    Packs/day: 0.50    Types: Cigarettes  . Smokeless tobacco: Never Used  Substance Use Topics  . Alcohol use: No  . Drug use: No     Allergies   Patient has no known allergies.   Review of Systems Review of Systems  Constitutional: Negative for fever.  Gastrointestinal: Negative for vomiting.  Skin: Positive for color change.  All other systems reviewed and are negative.    Physical Exam Updated Vital Signs BP 113/81   Pulse 79   Temp 98.2 F (36.8 C) (Oral)   Resp 18   Ht 6\' 2"  (1.88 m)   Wt 72.6 kg   SpO2 98%   BMI 20.54 kg/m   Physical Exam Vitals signs and nursing note reviewed.  Constitutional:      Appearance: Normal appearance.  HENT:     Head: Normocephalic and atraumatic.     Nose: Nose normal.     Mouth/Throat:     Mouth: Mucous membranes are moist.     Pharynx: Oropharynx is clear.  Eyes:  Conjunctiva/sclera: Conjunctivae normal.     Pupils: Pupils are equal, round, and reactive to light.  Neck:     Musculoskeletal: Normal range of motion and neck supple.  Cardiovascular:     Rate and Rhythm: Normal rate and regular rhythm.     Pulses: Normal pulses.     Heart sounds: Normal heart sounds.  Pulmonary:     Effort: Pulmonary effort is normal.     Breath sounds: Normal breath sounds.  Abdominal:     General: Abdomen is flat. Bowel sounds are normal.     Tenderness: There is no abdominal tenderness.  Genitourinary:    Comments: Chaperone present right buttock cheek with warmth and erythema and scab consistent with decompressed abscess.   Musculoskeletal: Normal range of motion.  Skin:    General: Skin is warm and dry.     Capillary Refill: Capillary refill takes less than  2 seconds.  Neurological:     General: No focal deficit present.     Mental Status: He is alert and oriented to person, place, and time.  Psychiatric:        Mood and Affect: Mood normal.        Behavior: Behavior normal.      ED Treatments / Results  Labs (all labs ordered are listed, but only abnormal results are displayed) Labs Reviewed - No data to display  EKG None  Radiology No results found.  Procedures Procedures (including critical care time)  Medications Ordered in ED Medications  sulfamethoxazole-trimethoprim (BACTRIM DS,SEPTRA DS) 800-160 MG per tablet 1 tablet (has no administration in time range)  ibuprofen (ADVIL,MOTRIN) tablet 800 mg (has no administration in time range)     Decompressed nothing to drain at this time.  Cellulitis only diameter of approximately of a quarter  Final Clinical Impressions(s) / ED Diagnoses   Return for pain, intractable cough, fevers >100.4 unrelieved by medication, shortness of breath, intractable vomiting, chest pain, shortness of breath, weakness numbness, changes in speech, facial asymmetry,  abdominal pain, passing out,Inability to tolerate liquids or food, cough, altered mental status or any concerns. No signs of systemic illness or infection. The patient is nontoxic-appearing on exam and vital signs are within normal limits.   I have reviewed the triage vital signs and the nursing notes. Pertinent labs &imaging results that were available during my care of the patient were reviewed by me and considered in my medical decision making (see chart for details).  After history, exam, and medical workup I feel the patient has been appropriately medically screened and is safe for discharge home. Pertinent diagnoses were discussed with the patient. Patient was given return precautions.   Ashwath Lasch, MD 08/18/18 9450

## 2018-08-17 NOTE — ED Notes (Signed)
Dr. Palumbo at bedside. 

## 2018-08-17 NOTE — ED Triage Notes (Signed)
Rectal abscess since yesterday.

## 2020-06-02 ENCOUNTER — Other Ambulatory Visit: Payer: Self-pay

## 2020-06-02 ENCOUNTER — Encounter (HOSPITAL_BASED_OUTPATIENT_CLINIC_OR_DEPARTMENT_OTHER): Payer: Self-pay

## 2020-06-02 ENCOUNTER — Emergency Department (HOSPITAL_BASED_OUTPATIENT_CLINIC_OR_DEPARTMENT_OTHER)
Admission: EM | Admit: 2020-06-02 | Discharge: 2020-06-02 | Disposition: A | Discharge status: Home / Self Care | Payer: Medicaid Other | Attending: Emergency Medicine | Admitting: Emergency Medicine

## 2020-06-02 ENCOUNTER — Other Ambulatory Visit (HOSPITAL_BASED_OUTPATIENT_CLINIC_OR_DEPARTMENT_OTHER): Payer: Self-pay | Admitting: Emergency Medicine

## 2020-06-02 DIAGNOSIS — L03317 Cellulitis of buttock: Secondary | ICD-10-CM | POA: Insufficient documentation

## 2020-06-02 DIAGNOSIS — Z79899 Other long term (current) drug therapy: Secondary | ICD-10-CM | POA: Insufficient documentation

## 2020-06-02 DIAGNOSIS — F17210 Nicotine dependence, cigarettes, uncomplicated: Secondary | ICD-10-CM | POA: Insufficient documentation

## 2020-06-02 DIAGNOSIS — R2241 Localized swelling, mass and lump, right lower limb: Secondary | ICD-10-CM | POA: Diagnosis present

## 2020-06-02 MED ORDER — DOXYCYCLINE HYCLATE 100 MG PO CAPS: 100.0000 mg | ORAL_CAPSULE | Freq: Two times a day (BID) | ORAL | 0 refills | Status: DC

## 2020-06-02 MED FILL — DOXYCYCLINE HYCLATE 100 MG: 100 | 7 days supply | Qty: 14 | Fill #0

## 2020-06-02 NOTE — ED Provider Notes (Signed)
[facility] EMERGENCY DEPARTMENT Provider Note   CSN: [id number] Arrival date & time: 06/02/20  0757     History Chief Complaint  Patient presents with   Abscess    Tony Morris is a 39 y.o. male.  HPI  Patient feels a swelling in right buttock. Felt that a couple days ago. States he has had previous had abscess and worries about another one occurring. No real pain. States he just feels it when he goes to wipe. No pain with bowel movements. No fevers. No drainage. Patient is otherwise healthy.     Past Medical History:  Diagnosis Date   Schizo affective schizophrenia (HCC)     There are no problems to display for this patient.   Past Surgical History:  Procedure Laterality Date   LUMBAR SPINE SURGERY         History reviewed. No pertinent family history.  Social History   Tobacco Use   Smoking status: Current Every Day Smoker    Packs/day: 2.00    Types: Cigarettes   Smokeless tobacco: Never Used  Vaping Use   Vaping Use: Never used  Substance Use Topics   Alcohol use: No   Drug use: No    Home Medications Prior to Admission medications   Medication Sig Start Date End Date Taking? Authorizing Provider  cyclobenzaprine (FLEXERIL) 10 MG tablet Take 1 tablet (10 mg total) by mouth 2 (two) times daily as needed for muscle spasms. 05/05/16   Rise Mu, PA-C  cyclobenzaprine (FLEXERIL) 5 MG tablet Take 1 tablet (5 mg total) by mouth 2 (two) times daily as needed for muscle spasms. 03/13/18   Horton, Mayer Masker, MD  doxycycline (VIBRAMYCIN) 100 MG capsule Take 1 capsule (100 mg total) by mouth 2 (two) times daily. 06/02/20   Benjiman Core, MD  escitalopram (LEXAPRO) 10 MG tablet Take by mouth daily.    [provider]  ibuprofen (ADVIL,MOTRIN) 600 MG tablet Take 1 tablet (600 mg total) by mouth every 6 (six) hours as needed. 03/03/16   Loren Racer, MD  ibuprofen (ADVIL,MOTRIN) 800 MG tablet Take 1 tablet (800 mg  total) by mouth 3 (three) times daily. 08/17/18   Palumbo, April, MD  naproxen (NAPROSYN) 375 MG tablet Take 1 tablet (375 mg total) by mouth 2 (two) times daily. 05/05/16   Rise Mu, PA-C  naproxen (NAPROSYN) 500 MG tablet Take 1 tablet (500 mg total) by mouth 2 (two) times daily. 03/13/18   Horton, Mayer Masker, MD  QUEtiapine (SEROQUEL) 100 MG tablet Take by mouth at bedtime.    [provider]    Allergies    Patient has no known allergies.  Review of Systems   Review of Systems  Constitutional: Negative for appetite change, chills and fever.  Respiratory: Negative for shortness of breath.   Cardiovascular: Negative for leg swelling.  Musculoskeletal: Negative for back pain.  Skin:       Buttock swelling.  Neurological: Negative for weakness and numbness.    Physical Exam Updated Vital Signs BP 127/81 (BP Location: Right Arm)    Pulse 72    Temp 97.7 F (36.5 C) (Oral)    Resp 18    Ht 6\' 2"  (1.88 m)    Wt 70.3 kg    SpO2 100%    BMI 19.90 kg/m   Physical Exam Vitals and nursing note reviewed.  HENT:     Head: Atraumatic.  Eyes:     Pupils: Pupils are equal, round,  and reactive to light.  Genitourinary:    Comments: On the medial right buttock there is an approximately 3 cm x 1 cm indurated area under the skin. No fluctuance. No skin changes. No drainage. Does not appear to extend very deep. Skin:    General: Skin is warm.     Capillary Refill: Capillary refill takes less than 2 seconds.  Neurological:     Mental Status: He is alert and oriented to person, place, and time.     ED Results / Procedures / Treatments   Labs (all labs ordered are listed, but only abnormal results are displayed) Labs Reviewed - No data to display  EKG None  Radiology No results found.  Procedures Procedures (including critical care time)  Medications Ordered in ED Medications - No data to display  ED Course  I have reviewed the triage vital signs and the nursing  notes.  Pertinent labs & imaging results that were available during my care of the patient were reviewed by me and considered in my medical decision making (see chart for details).    MDM Rules/Calculators/A&P                          Patient with previous abscess. Presents with new buttock swelling. Bedside ultrasound done and does not see liquid ready for drainage. Will give antibiotics and warm soaks. Discussed with patient that he may need a recheck in 2 to 3 days. Patient states there was no bump at this area prior to these last couple days. Differential diagnosis included cellulitis, abscess, mass. Final Clinical Impression(s) / ED Diagnoses Final diagnoses:  Cellulitis of buttock, right    Rx / DC Orders ED Discharge Orders         Ordered    doxycycline (VIBRAMYCIN) 100 MG capsule  2 times daily        06/02/20 0836           Benjiman Core, MD 06/02/20 0840

## 2020-06-02 NOTE — ED Triage Notes (Signed)
Pt states he had a boil on his buttock 2 years ago and thinks it is back. Pt states it doesn't hurt but he can feel the bump.

## 2020-06-02 NOTE — Discharge Instructions (Signed)
Take the antibiotics. Do warm soaks about 3 times a day. If does not improve have rechecked in 2 to 3 days. It may be an early abscess but does not appear ready to be drained yet.

## 2020-06-12 ENCOUNTER — Other Ambulatory Visit: Payer: Self-pay

## 2020-06-12 ENCOUNTER — Other Ambulatory Visit (HOSPITAL_BASED_OUTPATIENT_CLINIC_OR_DEPARTMENT_OTHER): Payer: Self-pay | Admitting: Emergency Medicine

## 2020-06-12 ENCOUNTER — Emergency Department (HOSPITAL_BASED_OUTPATIENT_CLINIC_OR_DEPARTMENT_OTHER)
Admission: EM | Admit: 2020-06-12 | Discharge: 2020-06-12 | Disposition: A | Discharge status: Home / Self Care | Payer: Medicaid Other | Attending: Emergency Medicine | Admitting: Emergency Medicine

## 2020-06-12 ENCOUNTER — Encounter (HOSPITAL_BASED_OUTPATIENT_CLINIC_OR_DEPARTMENT_OTHER): Payer: Self-pay

## 2020-06-12 DIAGNOSIS — L0231 Cutaneous abscess of buttock: Secondary | ICD-10-CM | POA: Insufficient documentation

## 2020-06-12 DIAGNOSIS — F17210 Nicotine dependence, cigarettes, uncomplicated: Secondary | ICD-10-CM | POA: Diagnosis not present

## 2020-06-12 DIAGNOSIS — Z79899 Other long term (current) drug therapy: Secondary | ICD-10-CM | POA: Diagnosis not present

## 2020-06-12 DIAGNOSIS — M79652 Pain in left thigh: Secondary | ICD-10-CM | POA: Diagnosis present

## 2020-06-12 MED ORDER — LIDOCAINE-EPINEPHRINE (PF) 2 %-1:200000 IJ SOLN
10.0000 mL | Freq: Once | INTRAMUSCULAR | Status: AC
  Administered 2020-06-12: 10 mL
  Filled 2020-06-12: qty 20

## 2020-06-12 MED ORDER — AMOXICILLIN-POT CLAVULANATE 875-125 MG PO TABS: 1.0000 | ORAL_TABLET | Freq: Two times a day (BID) | ORAL | 0 refills | Status: DC

## 2020-06-12 MED FILL — AMOX-CLAV 875-125 MG TABLET: 875-125 | 7 days supply | Qty: 14 | Fill #0

## 2020-06-12 NOTE — ED Notes (Signed)
ED Provider at bedside. 

## 2020-06-12 NOTE — ED Provider Notes (Signed)
[facility] EMERGENCY DEPARTMENT Provider Note   CSN: [id number] Arrival date & time: 06/12/20  9937     History Chief Complaint  Patient presents with   Abscess    Tony Morris is a 39 y.o. male.  HPI Patient was treated with doxycycline 11\22\21 for buttock abscess.  He reports he took the medication for a while (he did not specify how many days), then stopped to see if the abscess would get better.  He reports since then it has increased some in size and continues to be painful.  He reports he has been trying to squeeze it and thinks he got a small amount of drainage out at one point time.  No fevers, no chills, no abdominal pain, no associated symptoms.  He reports he had 1 similar type abscess that had to be drained previously.    Past Medical History:  Diagnosis Date   Schizo affective schizophrenia (HCC)     There are no problems to display for this patient.   Past Surgical History:  Procedure Laterality Date   LUMBAR SPINE SURGERY         History reviewed. No pertinent family history.  Social History   Tobacco Use   Smoking status: Current Every Day Smoker    Packs/day: 2.00    Types: Cigarettes   Smokeless tobacco: Never Used  Vaping Use   Vaping Use: Never used  Substance Use Topics   Alcohol use: No   Drug use: No    Home Medications Prior to Admission medications   Medication Sig Start Date End Date Taking? Authorizing Provider  amoxicillin-clavulanate (AUGMENTIN) 875-125 MG tablet Take 1 tablet by mouth 2 (two) times daily. One po bid x 7 days 06/12/20   Arby Barrette, MD  cyclobenzaprine (FLEXERIL) 10 MG tablet Take 1 tablet (10 mg total) by mouth 2 (two) times daily as needed for muscle spasms. 05/05/16   Rise Mu, PA-C  cyclobenzaprine (FLEXERIL) 5 MG tablet Take 1 tablet (5 mg total) by mouth 2 (two) times daily as needed for muscle spasms. 03/13/18   Horton, Mayer Masker, MD  doxycycline (VIBRAMYCIN) 100 MG  capsule Take 1 capsule (100 mg total) by mouth 2 (two) times daily. 06/02/20   Benjiman Core, MD  escitalopram (LEXAPRO) 10 MG tablet Take by mouth daily.    [provider]  ibuprofen (ADVIL,MOTRIN) 600 MG tablet Take 1 tablet (600 mg total) by mouth every 6 (six) hours as needed. 03/03/16   Loren Racer, MD  ibuprofen (ADVIL,MOTRIN) 800 MG tablet Take 1 tablet (800 mg total) by mouth 3 (three) times daily. 08/17/18   Palumbo, April, MD  naproxen (NAPROSYN) 375 MG tablet Take 1 tablet (375 mg total) by mouth 2 (two) times daily. 05/05/16   Rise Mu, PA-C  naproxen (NAPROSYN) 500 MG tablet Take 1 tablet (500 mg total) by mouth 2 (two) times daily. 03/13/18   Horton, Mayer Masker, MD  QUEtiapine (SEROQUEL) 100 MG tablet Take by mouth at bedtime.    [provider]    Allergies    Patient has no known allergies.  Review of Systems   Review of Systems 10 systems reviewed and negative except as per HPI Physical Exam Updated Vital Signs BP 116/80 (BP Location: Right Arm)    Pulse 68    Temp 98.5 F (36.9 C) (Oral)    Resp 18    Ht 6\' 2"  (1.88 m)    Wt 73.9 kg    SpO2  98%    BMI 20.93 kg/m   Physical Exam Constitutional:      Appearance: Normal appearance.  HENT:     Mouth/Throat:     Pharynx: Oropharynx is clear.  Pulmonary:     Effort: Pulmonary effort is normal.  Abdominal:     General: There is no distension.     Palpations: Abdomen is soft.     Tenderness: There is no abdominal tenderness. There is no guarding.  Genitourinary:    Comments: Left gluteal cleft nodule adjacent to anus but not contiguous, palpable approximately 2 cm x 1 cm mass.  Tender.  Overlying skin does not have erythema.. peri Anal tissues normal except several nonthrombosed, small hemorrhoids. Musculoskeletal:        General: Normal range of motion.  Neurological:     General: No focal deficit present.     Mental Status: He is alert and oriented to person, place, and time.    Psychiatric:        Mood and Affect: Mood normal.     ED Results / Procedures / Treatments   Labs (all labs ordered are listed, but only abnormal results are displayed) Labs Reviewed - No data to display  EKG None  Radiology No results found.  Procedures .Marland KitchenIncision and Drainage  Date/Time: 06/12/2020 9:14 AM Performed by: Arby Barrette, MD Authorized by: Arby Barrette, MD   Consent:    Consent obtained:  Verbal   Consent given by:  Patient   Risks discussed:  Bleeding, incomplete drainage, pain, damage to other organs and infection   Alternatives discussed:  Delayed treatment and referral Location:    Type:  Abscess   Size:  2   Location:  Anogenital   Anogenital location:  Perianal Pre-procedure details:    Skin preparation:  Betadine Anesthesia (see MAR for exact dosages):    Anesthesia method:  Local infiltration   Local anesthetic:  Lidocaine 2% WITH epi Procedure type:    Complexity:  Complex Procedure details:    Incision types:  Single straight   Incision depth:  Subcutaneous   Scalpel blade:  11   Wound management:  Probed and deloculated   Drainage:  Purulent and bloody   Drainage amount:  Scant   Packing materials:  1/4 in iodoform gauze   Amount 1/4" iodoform:  5 Post-procedure details:    Patient tolerance of procedure:  Tolerated well, no immediate complications Comments:     Drainage was pus tinged and bloody.  There is a fibrous cyst within the nodule.  Consistent with infected cyst.  Incised and drained and pus expressed.  Packed with iodoform gauze.  Patient tolerated well.   (including critical care time)  Medications Ordered in ED Medications  lidocaine-EPINEPHrine (XYLOCAINE W/EPI) 2 %-1:200000 (PF) injection 10 mL (10 mLs Infiltration Given by Other 06/12/20 0981)    ED Course  I have reviewed the triage vital signs and the nursing notes.  Pertinent labs & imaging results that were available during my care of the patient were  reviewed by me and considered in my medical decision making (see chart for details).    MDM Rules/Calculators/A&P                         Patient presents with a persistent nodule adjacent to the anus in the buttock.  This does not appear to be contiguous with the rectum.  Incision and drainage as outlined.  Patient is counseled on need to see  surgery for definitive management of cystic structure excision.  We have placed on Augmentin with recommendations for return to the emergency department or surgery in 2 to 3 days for packing removal and reexamination.  Return precautions reviewed. Final Clinical Impression(s) / ED Diagnoses Final diagnoses:  Abscess of buttock, left    Rx / DC Orders ED Discharge Orders         Ordered    amoxicillin-clavulanate (AUGMENTIN) 875-125 MG tablet  2 times daily        06/12/20 0908           Arby Barrette, MD 06/12/20 [phone]

## 2020-06-12 NOTE — ED Triage Notes (Signed)
Pt seen on 11/22 for same problem. Pt states he took the abx that he was given but it didn't help. Pt states it got "more hard and bothers me when I sit down."

## 2020-06-12 NOTE — Discharge Instructions (Addendum)
1.  You have a cyst that has become infected near your anus.  It was drained today.  The cyst portion still remains.  This needs to be removed surgically after the infection portion has healed.  Take all of your antibiotics as prescribed. 2.  There is a packing in the wound.  It looks like a little piece of ribbon.  Try to leave this in place.  If it falls out, keep the wound very clean and dress it. 3.  Return to the emergency department or follow-up with general surgery within the next 2 to 3 days for removing of your packing.  Return to the emergency department sooner if you are getting increasing redness, pain or fevers.  You may see some drainage from the wound, this is expected.

## 2020-08-20 ENCOUNTER — Encounter (HOSPITAL_BASED_OUTPATIENT_CLINIC_OR_DEPARTMENT_OTHER): Payer: Self-pay | Admitting: Emergency Medicine

## 2020-08-20 ENCOUNTER — Other Ambulatory Visit: Payer: Self-pay

## 2020-08-20 ENCOUNTER — Emergency Department (HOSPITAL_BASED_OUTPATIENT_CLINIC_OR_DEPARTMENT_OTHER)
Admission: EM | Admit: 2020-08-20 | Discharge: 2020-08-20 | Disposition: A | Discharge status: Home / Self Care | Payer: Medicaid Other | Attending: Emergency Medicine | Admitting: Emergency Medicine

## 2020-08-20 DIAGNOSIS — Y30XXXA Falling, jumping or pushed from a high place, undetermined intent, initial encounter: Secondary | ICD-10-CM | POA: Insufficient documentation

## 2020-08-20 DIAGNOSIS — F17210 Nicotine dependence, cigarettes, uncomplicated: Secondary | ICD-10-CM | POA: Diagnosis not present

## 2020-08-20 DIAGNOSIS — S8781XA Crushing injury of right lower leg, initial encounter: Secondary | ICD-10-CM | POA: Diagnosis present

## 2020-08-20 DIAGNOSIS — S80811A Abrasion, right lower leg, initial encounter: Secondary | ICD-10-CM | POA: Insufficient documentation

## 2020-08-20 MED ORDER — NAPROXEN 250 MG PO TABS
500.0000 mg | ORAL_TABLET | Freq: Once | ORAL | Status: AC
  Administered 2020-08-20: 500 mg via ORAL
  Filled 2020-08-20: qty 2

## 2020-08-20 MED ORDER — CYCLOBENZAPRINE HCL 10 MG PO TABS: 10.0000 mg | ORAL_TABLET | Freq: Three times a day (TID) | ORAL | 0 refills | Status: AC | PRN

## 2020-08-20 MED ORDER — NAPROXEN 375 MG PO TABS: ORAL_TABLET | ORAL | 0 refills | Status: AC

## 2020-08-20 MED ORDER — CYCLOBENZAPRINE HCL 10 MG PO TABS
10.0000 mg | ORAL_TABLET | Freq: Once | ORAL | Status: AC
  Administered 2020-08-20: 10 mg via ORAL
  Filled 2020-08-20: qty 1

## 2020-08-20 NOTE — ED Provider Notes (Signed)
[facility] Provider Note: Lowella Dell, MD, FACEP  CSN: [id number] MRN: [id number] ARRIVAL: 08/20/20 at 0544 ROOM: [facility]10/[facility]   CHIEF COMPLAINT  Leg Injury   HISTORY OF PRESENT ILLNESS  08/20/20 6:15 AM Tony Morris is a 40 y.o. male who injured his right lower leg jumping over a fence yesterday evening.  He is having pain in his right lower leg.  Particularly he is having pain in the proximal anterolateral aspect of the leg.  He describes the pain as feeling like a muscle spasm, moderate in severity.  There is a superficial abrasion at that site.  He denies other injury.  He is not sure of his tetanus status.   Past Medical History:  Diagnosis Date  . Schizo affective schizophrenia [facility])     Past Surgical History:  Procedure Laterality Date  . LUMBAR SPINE SURGERY      No family history on file.  Social History   Tobacco Use  . Smoking status: Current Every Day Smoker    Packs/day: 2.00    Types: Cigarettes  . Smokeless tobacco: Never Used  Vaping Use  . Vaping Use: Never used  Substance Use Topics  . Alcohol use: No  . Drug use: No    Prior to Admission medications   Not on File    Allergies Patient has no known allergies.   REVIEW OF SYSTEMS  Negative except as noted here or in the History of Present Illness.   PHYSICAL EXAMINATION  Initial Vital Signs Blood pressure 130/75, pulse 79, temperature 98.1 F (36.7 C), temperature source Oral, resp. rate 16, height 6\' 2"  (1.88 m), weight 77.1 kg, SpO2 100 %.  Examination General: Well-developed, well-nourished male in no acute distress; appearance consistent with age of record HENT: normocephalic; atraumatic Eyes: Normal appearance Neck: supple Heart: regular rate and rhythm Lungs: clear to auscultation bilaterally Abdomen: soft; nondistended; nontender; bowel sounds present Extremities: No deformity; full range of motion; tenderness and superficial abrasion of right proximal  anterolateral lower leg without swelling or ecchymosis, muscle compartment soft; pulses normal Neurologic: Awake, alert and oriented; motor function intact in all extremities and symmetric; no facial droop Skin: Warm and dry Psychiatric: Normal mood and affect   RESULTS  Summary of this visit's results, reviewed and interpreted by myself:   EKG Interpretation  Date/Time:    Ventricular Rate:    PR Interval:    QRS Duration:   QT Interval:    QTC Calculation:   R Axis:     Text Interpretation:        Laboratory Studies: No results found for this or any previous visit (from the past 24 hour(s)). Imaging Studies: No results found.  ED COURSE and MDM  Nursing notes, initial and subsequent vitals signs, including pulse oximetry, reviewed and interpreted by myself.  Vitals:   08/20/20 0557 08/20/20 0600  BP:  130/75  Pulse:  79  Resp:  16  Temp: 98.1 F (36.7 C)   TempSrc: Oral   SpO2:  100%  Weight: 77.1 kg   Height: 6\' 2"  (1.88 m)    Medications  cyclobenzaprine (FLEXERIL) tablet 10 mg (has no administration in time range)  naproxen (NAPROSYN) tablet 500 mg (has no administration in time range)    Patient declined radiograph as well as tetanus booster.  PROCEDURES  Procedures   ED DIAGNOSES     ICD-10-CM   1. Crushing injury of right lower leg, initial encounter  S87.81XA  Tony Morris, Tony Ruiz, MD 08/20/20 [phone]

## 2020-08-20 NOTE — ED Notes (Signed)
Pt declines xray stating, "it's a muscle spasm."

## 2020-08-20 NOTE — ED Triage Notes (Signed)
Pt c/o right lower leg pain after jumping over a fence last night

## 2021-10-03 ENCOUNTER — Encounter (HOSPITAL_BASED_OUTPATIENT_CLINIC_OR_DEPARTMENT_OTHER): Payer: Self-pay

## 2021-10-03 ENCOUNTER — Other Ambulatory Visit: Payer: Self-pay

## 2021-10-03 ENCOUNTER — Emergency Department (HOSPITAL_BASED_OUTPATIENT_CLINIC_OR_DEPARTMENT_OTHER)
Admission: EM | Admit: 2021-10-03 | Discharge: 2021-10-03 | Disposition: A | Discharge status: Home / Self Care | Payer: Medicaid Other | Attending: Emergency Medicine | Admitting: Emergency Medicine

## 2021-10-03 DIAGNOSIS — R220 Localized swelling, mass and lump, head: Secondary | ICD-10-CM | POA: Diagnosis present

## 2021-10-03 MED ORDER — DOXYCYCLINE HYCLATE 100 MG PO CAPS: 100.0000 mg | ORAL_CAPSULE | Freq: Two times a day (BID) | ORAL | 0 refills | Status: AC

## 2021-10-03 NOTE — ED Triage Notes (Signed)
Pt c/o right sided facial swelling since yesterday. Denies pain. Hx of same, dx with abscess. ?

## 2021-10-03 NOTE — ED Provider Notes (Signed)
? ?Emergency Department Provider Note ? ? ?I have reviewed the triage vital signs and the nursing notes. ? ? ?HISTORY ? ?Chief Complaint ?Facial Swelling ? ? ?HPI ?Tony Morris is a 41 y.o. male with past medical history reviewed below presents emergency department with right face swelling.  Patient denies any dental pain, trouble breathing, trouble swallowing.  Patient awoke with symptoms this morning and noticed some swelling to the right side of the face.  He has history of gluteal abscess in the past and is concerned for potential infection in this area.  He is not having any ear pain or drainage.  No vision changes.  No headaches.  No fevers.  ? ? ?Past Medical History:  ?Diagnosis Date  ? Schizo affective schizophrenia ([place])   ? ? ?Review of Systems ? ?Constitutional: No fever/chills ?ENT: No sore throat. ?Cardiovascular: Denies chest pain. ?Respiratory: Denies shortness of breath. ?Gastrointestinal: No abdominal pain.  ?Musculoskeletal: Negative for back pain. ?Skin: Negative for rash. ?Neurological: Negative for headaches. ? ? ?____________________________________________ ? ? ?PHYSICAL EXAM: ? ?VITAL SIGNS: ?ED Triage Vitals  ?Enc Vitals Group  ?   BP 10/03/21 1211 123/88  ?   Pulse Rate 10/03/21 1211 75  ?   Resp 10/03/21 1211 18  ?   Temp 10/03/21 1211 98 ?F (36.7 ?C)  ?   Temp Source 10/03/21 1211 Oral  ?   SpO2 10/03/21 1211 99 %  ?   Weight 10/03/21 1209 170 lb (77.1 kg)  ?   Height 10/03/21 1209 6\' 2"  (1.88 m)  ? ?Constitutional: Alert and oriented. Well appearing and in no acute distress. ?Eyes: Conjunctivae are normal. ?Head: Atraumatic. ?Nose: No congestion/rhinnorhea. ?Mouth/Throat: Mucous membranes are moist.  Oropharynx non-erythematous. No PTA. No trismus.  ?Neck: No stridor.  ?Cardiovascular: Normal rate, regular rhythm. Good peripheral circulation. Grossly normal heart sounds.   ?Respiratory: Normal respiratory effort.  No retractions. Lungs CTAB. ?Gastrointestinal: Soft and nontender. No  distention.  ?Musculoskeletal: No lower extremity tenderness nor edema. No gross deformities of extremities. ?Neurologic:  Normal speech and language. No gross focal neurologic deficits are appreciated.  ?Skin:  Skin is warm and dry. Mild swelling to the right cheek. No abscess/fluctuance. Faint erythema.  ? ? ?____________________________________________ ? ? ?PROCEDURES ? ?Procedure(s) performed:  ? ?Procedures ? ?None  ?____________________________________________ ? ? ?INITIAL IMPRESSION / ASSESSMENT AND PLAN / ED COURSE ? ?Pertinent labs & imaging results that were available during my care of the patient were reviewed by me and considered in my medical decision making (see chart for details). ?  ?This patient is Presenting for Evaluation of face pain, which does require a range of treatment options, and is a complaint that involves a high risk of morbidity and mortality. ? ?The Differential Diagnoses include face cellulitis, abscess, dental abscess, PTA, retropharyngeal abscess. ? ? ? ?Radiologic Tests: Considered need for face/neck imaging. Patient with very mild symptoms and reassuring exam. Defer for now.  ? ?Medical Decision Making: Summary:  ?Patient presents emergency department with mild right face swelling starting this morning.  No dental pain or oropharyngeal abscess noted on exam.  Very minimal swelling if anything.  Faint erythema.  Could be developing facial cellulitis although difficult to say at this point.  Patient does have history of multiple ED visits for gluteal abscess. Plan for PO abx and PCP follow up.  ? ?Disposition: discharge ? ?____________________________________________ ? ?FINAL CLINICAL IMPRESSION(S) / ED DIAGNOSES ? ?Final diagnoses:  ?Facial swelling  ? ? ? ?NEW OUTPATIENT  MEDICATIONS STARTED DURING THIS VISIT: ? ?Discharge Medication List as of 10/03/2021 12:29 PM  ?  ? ?START taking these medications  ? Details  ?doxycycline (VIBRAMYCIN) 100 MG capsule Take 1 capsule (100 mg  total) by mouth 2 (two) times daily for 7 days., Starting Sat 10/03/2021, Until Sat 10/10/2021, Normal  ?  ?  ? ? ?Note:  This document was prepared using Dragon voice recognition software and may include unintentional dictation errors. ? ?Nanda Quinton, MD, FACEP ?Emergency Medicine ? ?  ?Margette Fast, MD ?10/03/21 1234 ? ?

## 2021-10-03 NOTE — Discharge Instructions (Signed)
You were seen in the emergency room today with a swelling.  I am starting you on antibiotics and will have you follow with the primary care doctor.  Have listed on the form here for you to call.  If you develop any new or suddenly worsening symptoms please return to the emergency department for reevaluation. ?

## 2022-09-23 ENCOUNTER — Encounter (HOSPITAL_BASED_OUTPATIENT_CLINIC_OR_DEPARTMENT_OTHER): Payer: Self-pay

## 2022-09-23 ENCOUNTER — Emergency Department (HOSPITAL_BASED_OUTPATIENT_CLINIC_OR_DEPARTMENT_OTHER)
Admission: EM | Admit: 2022-09-23 | Discharge: 2022-09-23 | Disposition: A | Discharge status: Home / Self Care | Payer: Medicaid Other | Attending: Emergency Medicine | Admitting: Emergency Medicine

## 2022-09-23 ENCOUNTER — Other Ambulatory Visit: Payer: Self-pay

## 2022-09-23 DIAGNOSIS — K0889 Other specified disorders of teeth and supporting structures: Secondary | ICD-10-CM

## 2022-09-23 MED ORDER — KETOROLAC TROMETHAMINE 15 MG/ML IJ SOLN
15.0000 mg | Freq: Once | INTRAMUSCULAR | Status: AC
  Administered 2022-09-23: 15 mg via INTRAMUSCULAR
  Filled 2022-09-23: qty 1

## 2022-09-23 MED ORDER — PENICILLIN V POTASSIUM 250 MG PO TABS
500.0000 mg | ORAL_TABLET | Freq: Once | ORAL | Status: AC
  Administered 2022-09-23: 500 mg via ORAL
  Filled 2022-09-23: qty 2

## 2022-09-23 MED ORDER — PENICILLIN V POTASSIUM 500 MG PO TABS: 500.0000 mg | ORAL_TABLET | Freq: Four times a day (QID) | ORAL | 0 refills | Status: AC

## 2022-09-23 NOTE — ED Provider Notes (Signed)
[facility] Provider Note   CSN: [id number] Arrival date & time: 09/23/22  1023     History  Chief Complaint  Patient presents with   Dental Pain    RAYSHAWN ROSATO is a 42 y.o. male with a past medical history significant for schizoaffective disorder who presents to the ED due to left lower facial edema and dental pain for the past few days.  Patient has a scheduled dental procedure for extraction next week.  Patient states pain has gotten so severe and is requesting antibiotics.  Denies difficulty swallowing.  No fever or chills.  History of same.  History obtained from patient and past medical records. No interpreter used during encounter.       Home Medications Prior to Admission medications   Medication Sig Start Date End Date Taking? Authorizing Provider  penicillin v potassium (VEETID) 500 MG tablet Take 1 tablet (500 mg total) by mouth 4 (four) times daily for 7 days. 09/23/22 09/30/22 Yes Jawon Dipiero, Druscilla Brownie, PA-C  cyclobenzaprine (FLEXERIL) 10 MG tablet Take 1 tablet (10 mg total) by mouth 3 (three) times daily as needed for muscle spasms. 08/20/20   Molpus, John, MD  naproxen (NAPROSYN) 375 MG tablet Take 1 tablet twice daily as needed for pain. 08/20/20   Molpus, Jenny Reichmann, MD      Allergies    Patient has no known allergies.    Review of Systems   Review of Systems  HENT:  Positive for dental problem and facial swelling.   Respiratory:  Negative for shortness of breath.   Cardiovascular:  Negative for chest pain.    Physical Exam Updated Vital Signs BP (!) 145/108 (BP Location: Right Arm)   Pulse 82   Temp 98.6 F (37 C) (Oral)   Resp 18   Ht '6\' 2"'$  (1.88 m)   Wt 77.1 kg   SpO2 96%   BMI 21.83 kg/m  Physical Exam Vitals and nursing note reviewed.  Constitutional:      General: He is not in acute distress.    Appearance: He is not ill-appearing.  HENT:     Head: Normocephalic.     Mouth/Throat:     Comments:  Poor dentition throughout.  Left lower posterior molar with surrounding gingival edema.  Left-sided facial edema.  Tongue in normal position without protrusion.  Patient tolerating oral secretions without difficulty.  No abscess. Eyes:     Pupils: Pupils are equal, round, and reactive to light.  Cardiovascular:     Rate and Rhythm: Normal rate and regular rhythm.     Pulses: Normal pulses.     Heart sounds: Normal heart sounds. No murmur heard.    No friction rub. No gallop.  Pulmonary:     Effort: Pulmonary effort is normal.     Breath sounds: Normal breath sounds.  Abdominal:     General: Abdomen is flat. There is no distension.     Palpations: Abdomen is soft.     Tenderness: There is no abdominal tenderness. There is no guarding or rebound.  Musculoskeletal:        General: Normal range of motion.     Cervical back: Neck supple.  Skin:    General: Skin is warm and dry.  Neurological:     General: No focal deficit present.     Mental Status: He is alert.  Psychiatric:        Mood and Affect: Mood normal.  Behavior: Behavior normal.     ED Results / Procedures / Treatments   Labs (all labs ordered are listed, but only abnormal results are displayed) Labs Reviewed - No data to display  EKG None  Radiology No results found.  Procedures Procedures    Medications Ordered in ED Medications  ketorolac (TORADOL) 15 MG/ML injection 15 mg (has no administration in time range)  penicillin v potassium (VEETID) tablet 500 mg (has no administration in time range)    ED Course/ Medical Decision Making/ A&P                             Medical Decision Making  42 year old male with history of schizoaffective disorder who presents to the ED due to left-sided dental pain and facial edema for the past few days.  Patient has a scheduled dental appointment for extraction next week.  Patient is requesting antibiotics.  Upon arrival, patient afebrile, not tachycardic or  hypoxic.  Patient in no acute distress.  Patient appeared extremely frustrated during initial evaluation and did not allow for a thorough exam because he notes he is in a "rush".  he does have some left-sided facial edema.  Left lower posterior molar with surrounding gingival edema.  No abscess visualized that I could I&D.  Patient given Pen-VK per request and a shot of Toradol.  Advised patient report to his dental appointment as scheduled.  No evidence of airway compromise.  Patient tolerating oral secretions without difficulty.  Low suspicion for Ludwigs or deep space infection. Strict ED precautions discussed with patient. Patient states understanding and agrees to plan. Patient discharged home in no acute distress and stable vitals  No PCP       Final Clinical Impression(s) / ED Diagnoses Final diagnoses:  Pain, dental    Rx / DC Orders ED Discharge Orders          Ordered    penicillin v potassium (VEETID) 500 MG tablet  4 times daily        09/23/22 1258              Suzy Bouchard, [place] 09/23/22 1259    Audley Hose, MD 09/28/22 2158

## 2022-09-23 NOTE — ED Triage Notes (Signed)
C/o swelling to left lower cheek, can't see dentist until next week. Here for pain control and antibiotics.

## 2022-09-23 NOTE — ED Notes (Signed)
Pt just requested antbx for L jaw swelling. Advised he's to have a lower premolar pulled next week. Multiple missing teeth and various stages of disease. No significant pain, does not want pain medicine, just wants a script before he can reschedule with his dentist next week.

## 2022-09-23 NOTE — ED Notes (Signed)
Reviewed discharge instructions and medications with pt .  Pt states undersstanding

## 2022-09-23 NOTE — Discharge Instructions (Addendum)
It was a pleasure taking care of you today.  As discussed, please report to your dental appointment next week.  I am sending you home with antibiotics.  Take as prescribed and finish all antibiotics.  You may take over-the-counter ibuprofen or Tylenol as needed for pain.  Return to the ER for any worsening symptoms.
